# Patient Record
Sex: Male | Born: 1960 | Race: White | Hispanic: No | Marital: Married | Smoking: Former smoker
Health system: Southern US, Community
[De-identification: ages and names within clinical notes are randomized; demographics above are authoritative.]

## PROBLEM LIST (undated history)

## (undated) DIAGNOSIS — I10 Essential (primary) hypertension: Secondary | ICD-10-CM

## (undated) DIAGNOSIS — M1990 Unspecified osteoarthritis, unspecified site: Secondary | ICD-10-CM

## (undated) DIAGNOSIS — J301 Allergic rhinitis due to pollen: Secondary | ICD-10-CM

## (undated) DIAGNOSIS — R7303 Prediabetes: Secondary | ICD-10-CM

## (undated) DIAGNOSIS — K219 Gastro-esophageal reflux disease without esophagitis: Secondary | ICD-10-CM

## (undated) DIAGNOSIS — E559 Vitamin D deficiency, unspecified: Secondary | ICD-10-CM

## (undated) DIAGNOSIS — Z87442 Personal history of urinary calculi: Secondary | ICD-10-CM

## (undated) DIAGNOSIS — I499 Cardiac arrhythmia, unspecified: Secondary | ICD-10-CM

## (undated) DIAGNOSIS — R42 Dizziness and giddiness: Secondary | ICD-10-CM

## (undated) DIAGNOSIS — K8020 Calculus of gallbladder without cholecystitis without obstruction: Secondary | ICD-10-CM

## (undated) HISTORY — PX: EYE SURGERY: SHX253

## (undated) HISTORY — DX: Dizziness and giddiness: R42

## (undated) HISTORY — DX: Cardiac arrhythmia, unspecified: I49.9

## (undated) HISTORY — DX: Vitamin D deficiency, unspecified: E55.9

## (undated) HISTORY — DX: Allergic rhinitis due to pollen: J30.1

## (undated) HISTORY — PX: CHOLECYSTECTOMY: SHX55

## (undated) HISTORY — DX: Essential (primary) hypertension: I10

---

## 1988-07-11 HISTORY — PX: EYE SURGERY: SHX253

## 2002-04-23 ENCOUNTER — Ambulatory Visit (HOSPITAL_COMMUNITY): Admission: RE | Admit: 2002-04-23 | Discharge: 2002-04-23 | Payer: Self-pay | Admitting: *Deleted

## 2002-04-23 ENCOUNTER — Encounter: Payer: Self-pay | Admitting: *Deleted

## 2004-08-03 ENCOUNTER — Emergency Department (HOSPITAL_COMMUNITY): Admission: EM | Admit: 2004-08-03 | Discharge: 2004-08-03 | Payer: Self-pay | Admitting: Family Medicine

## 2007-10-24 ENCOUNTER — Emergency Department (HOSPITAL_COMMUNITY): Admission: EM | Admit: 2007-10-24 | Discharge: 2007-10-24 | Payer: Self-pay | Admitting: Emergency Medicine

## 2008-07-07 ENCOUNTER — Emergency Department (HOSPITAL_COMMUNITY): Admission: EM | Admit: 2008-07-07 | Discharge: 2008-07-07 | Payer: Self-pay | Admitting: Family Medicine

## 2008-10-06 ENCOUNTER — Emergency Department (HOSPITAL_COMMUNITY): Admission: EM | Admit: 2008-10-06 | Discharge: 2008-10-06 | Payer: Self-pay | Admitting: Family Medicine

## 2008-11-21 ENCOUNTER — Emergency Department (HOSPITAL_COMMUNITY): Admission: EM | Admit: 2008-11-21 | Discharge: 2008-11-21 | Payer: Self-pay | Admitting: Family Medicine

## 2011-04-05 LAB — URINALYSIS, ROUTINE W REFLEX MICROSCOPIC
Bilirubin Urine: NEGATIVE
Glucose, UA: NEGATIVE
Ketones, ur: NEGATIVE
Leukocytes, UA: NEGATIVE
Protein, ur: NEGATIVE
pH: 6.5

## 2011-04-05 LAB — BASIC METABOLIC PANEL
BUN: 13
CO2: 26
Calcium: 9.5
Chloride: 104
Creatinine, Ser: 0.89
GFR calc Af Amer: >60
GFR calc non Af Amer: >60
Glucose, Bld: 116 — ABNORMAL HIGH
Potassium: 3.8
Sodium: 136

## 2011-04-05 LAB — CBC
HCT: 49.5
Hemoglobin: 16.1
MCHC: 32.6
MCV: 88.9
Platelets: 231
RBC: 5.56
RDW: 12.7
WBC: 5.3

## 2011-04-05 LAB — DIFFERENTIAL
Eosinophils Absolute: 0.1
Eosinophils Relative: 1
Lymphocytes Relative: 29
Lymphs Abs: 1.5
Monocytes Absolute: 0.5

## 2011-04-05 LAB — URINE MICROSCOPIC-ADD ON

## 2016-08-05 DIAGNOSIS — R0789 Other chest pain: Secondary | ICD-10-CM | POA: Diagnosis not present

## 2016-08-08 DIAGNOSIS — R0789 Other chest pain: Secondary | ICD-10-CM | POA: Diagnosis not present

## 2016-08-25 DIAGNOSIS — R079 Chest pain, unspecified: Secondary | ICD-10-CM

## 2016-08-25 HISTORY — DX: Chest pain, unspecified: R07.9

## 2016-08-30 DIAGNOSIS — R079 Chest pain, unspecified: Secondary | ICD-10-CM | POA: Diagnosis not present

## 2017-02-22 DIAGNOSIS — H66002 Acute suppurative otitis media without spontaneous rupture of ear drum, left ear: Secondary | ICD-10-CM | POA: Diagnosis not present

## 2017-07-25 DIAGNOSIS — J209 Acute bronchitis, unspecified: Secondary | ICD-10-CM | POA: Diagnosis not present

## 2017-07-25 DIAGNOSIS — J0100 Acute maxillary sinusitis, unspecified: Secondary | ICD-10-CM | POA: Diagnosis not present

## 2017-08-14 DIAGNOSIS — J209 Acute bronchitis, unspecified: Secondary | ICD-10-CM | POA: Diagnosis not present

## 2017-09-25 ENCOUNTER — Emergency Department (HOSPITAL_COMMUNITY): Payer: 59

## 2017-09-25 ENCOUNTER — Emergency Department (HOSPITAL_COMMUNITY)
Admission: EM | Admit: 2017-09-25 | Discharge: 2017-09-25 | Disposition: A | Discharge status: Home / Self Care | Payer: 59 | Attending: Emergency Medicine | Admitting: Emergency Medicine

## 2017-09-25 ENCOUNTER — Encounter (HOSPITAL_COMMUNITY): Payer: Self-pay | Admitting: Emergency Medicine

## 2017-09-25 DIAGNOSIS — R42 Dizziness and giddiness: Secondary | ICD-10-CM | POA: Diagnosis present

## 2017-09-25 LAB — URINALYSIS, ROUTINE W REFLEX MICROSCOPIC
Bilirubin Urine: NEGATIVE
Glucose, UA: NEGATIVE mg/dL
Hgb urine dipstick: NEGATIVE
KETONES UR: NEGATIVE mg/dL
LEUKOCYTES UA: NEGATIVE
NITRITE: NEGATIVE
Protein, ur: NEGATIVE mg/dL
SPECIFIC GRAVITY, URINE: 1.018 (ref 1.005–1.030)
pH: 5 (ref 5.0–8.0)

## 2017-09-25 LAB — BASIC METABOLIC PANEL
ANION GAP: 10 (ref 5–15)
BUN: 20 mg/dL (ref 6–20)
CO2: 24 mmol/L (ref 22–32)
Calcium: 9.6 mg/dL (ref 8.9–10.3)
Chloride: 109 mmol/L (ref 101–111)
Creatinine, Ser: 0.84 mg/dL (ref 0.61–1.24)
GFR calc Af Amer: >60 mL/min (ref >60)
Glucose, Bld: 114 mg/dL — ABNORMAL HIGH (ref 65–99)
POTASSIUM: 3.9 mmol/L (ref 3.5–5.1)
SODIUM: 143 mmol/L (ref 135–145)

## 2017-09-25 LAB — CBC
HCT: 47.5 % (ref 39.0–52.0)
HEMOGLOBIN: 17.1 g/dL — AB (ref 13.0–17.0)
MCH: 32.1 pg (ref 26.0–34.0)
MCHC: 36 g/dL (ref 30.0–36.0)
MCV: 89.1 fL (ref 78.0–100.0)
Platelets: 218 10*3/uL (ref 150–400)
RBC: 5.33 MIL/uL (ref 4.22–5.81)
RDW: 12.8 % (ref 11.5–15.5)
WBC: 4.9 10*3/uL (ref 4.0–10.5)

## 2017-09-25 LAB — CBG MONITORING, ED: Glucose-Capillary: 116 mg/dL — ABNORMAL HIGH (ref 65–99)

## 2017-09-25 MED ORDER — MECLIZINE HCL 25 MG PO TABS
12.5000 mg | ORAL_TABLET | Freq: Once | ORAL | Status: AC
  Administered 2017-09-25: 12.5 mg via ORAL
  Filled 2017-09-25: qty 1

## 2017-09-25 MED ORDER — LORATADINE 10 MG PO TABS
10.0000 mg | ORAL_TABLET | Freq: Once | ORAL | Status: AC
  Administered 2017-09-25: 10 mg via ORAL
  Filled 2017-09-25: qty 1

## 2017-09-25 MED ORDER — MECLIZINE HCL 12.5 MG PO TABS: 12.5000 mg | ORAL_TABLET | Freq: Three times a day (TID) | ORAL | 0 refills | Status: DC | PRN

## 2017-09-25 MED ORDER — PSEUDOEPHEDRINE HCL ER 120 MG PO TB12
120.0000 mg | ORAL_TABLET | Freq: Once | ORAL | Status: AC
  Administered 2017-09-25: 120 mg via ORAL
  Filled 2017-09-25: qty 1

## 2017-09-25 NOTE — ED Notes (Signed)
MD notified of symptoms

## 2017-09-25 NOTE — Discharge Instructions (Signed)
It was our pleasure to provide your ER care today - we hope that you feel better.  Take antivert as need for dizziness - no driving when taking antivert, or when feeling dizzy.  Try zyrtec-d or claritin-d (available over the counter) for fluid/ear congestion.   Follow up with primary care doctor/ENT doctor in 1  week if symptoms fail to improve/resolve.  Also have your blood pressure rechecked by primary care doctor as it is high today, and follow up with them regarding your cholesterol/triglycerides.   Return to ER if worse, new symptoms, new or severe pain, passing out, new numbness/weakness, other concern.

## 2017-09-25 NOTE — ED Notes (Signed)
Patient transported to MRI 

## 2017-09-25 NOTE — ED Provider Notes (Addendum)
[facility]-EMERGENCY DEPT Provider Note   CSN: [id number] Arrival date & time: 09/25/17  0800     History   Chief Complaint Chief Complaint  Patient presents with  . Dizziness    HPI David Whitaker is a 57 y.o. male.  Patient c/o dizziness/unsteadiness for the past 3 weeks.  States episodes seem to wax and wane. Also states in general, head doesn't feel 'right'. No focal pain or headache. Denies neck pain or stiffness. Dizziness described vaguely as both mild room movement sensation, and also at times an unsteadiness when on feet, with mild lightheaded component. No syncope. Symptoms occur at rest. At times worse w head movement, change of position, and/or standing. Denies any associated chest pain or discomfort, no palpitations. Denies cough, nasal congestion or uri symptoms. No ear pain or tinnitus. No hearing loss. No extremity numbness/weakness. No blood loss or melena. No recent change in meds.    The history is provided by the patient.  Dizziness  Associated symptoms: no chest pain, no headaches and no shortness of breath     History reviewed. No pertinent past medical history.  There are no active problems to display for this patient.   History reviewed. No pertinent surgical history.     Home Medications    Prior to Admission medications   Not on File    Family History No family history on file.  Social History Social History   Tobacco Use  . Smoking status: Never Smoker  Substance Use Topics  . Alcohol use: Yes  . Drug use: No     Allergies   Patient has no allergy information on record.   Review of Systems Review of Systems  Constitutional: Negative for fever.  HENT: Negative for sore throat.   Eyes: Negative for redness.  Respiratory: Negative for shortness of breath.   Cardiovascular: Negative for chest pain.  Gastrointestinal: Negative for abdominal pain.  Genitourinary: Negative for flank pain.  Musculoskeletal:  Negative for back pain and neck pain.  Skin: Negative for rash.  Neurological: Positive for dizziness and light-headedness. Negative for headaches.  Hematological: Does not bruise/bleed easily.  Psychiatric/Behavioral: Negative for confusion.     Physical Exam Updated Vital Signs BP (!) 153/99 (BP Location: Left Arm)   Pulse 90   Temp (!) 97.3 F (36.3 C) (Oral)   Resp 16   Ht 1.778 m (5\' 10" )   Wt 97.5 kg (215 lb)   SpO2 99%   BMI 30.85 kg/m   Physical Exam  Constitutional: He appears well-developed and well-nourished. No distress.  HENT:  Head: Atraumatic.  Nose: Nose normal.  Mouth/Throat: Oropharynx is clear and moist.  Clear fluid behind tms. No acute om. No mastoid tenderness.   Eyes: Conjunctivae and EOM are normal. Pupils are equal, round, and reactive to light.  Neck: Neck supple. No tracheal deviation present. No thyromegaly present.  No bruits.   Cardiovascular: Normal rate, regular rhythm, normal heart sounds and intact distal pulses. Exam reveals no gallop and no friction rub.  No murmur heard. Pulmonary/Chest: Effort normal and breath sounds normal. No accessory muscle usage. No respiratory distress.  Abdominal: Soft. Bowel sounds are normal. He exhibits no distension. There is no tenderness.  Genitourinary:  Genitourinary Comments: No cva tenderness  Musculoskeletal: He exhibits no edema.  Neurological: He is alert. No cranial nerve deficit.  Speech clear/fluent. Motor intact bil, stre 5/5. No pronator drift. sens grossly intact. Gait mildly unsteady. No nystagmus.   Skin: Skin is warm  and dry. He is not diaphoretic.  Psychiatric: He has a normal mood and affect.  Nursing note and vitals reviewed.    ED Treatments / Results  Labs (all labs ordered are listed, but only abnormal results are displayed) Results for orders placed or performed during the hospital encounter of 09/25/17  Basic metabolic panel  Result Value Ref Range   Sodium 143 135 - 145  mmol/L   Potassium 3.9 3.5 - 5.1 mmol/L   Chloride 109 101 - 111 mmol/L   CO2 24 22 - 32 mmol/L   Glucose, Bld 114 (H) 65 - 99 mg/dL   BUN 20 6 - 20 mg/dL   Creatinine, Ser 4.090.84 0.61 - 1.24 mg/dL   Calcium 9.6 8.9 - 81.110.3 mg/dL   GFR calc non Af Amer >60 >60 mL/min   GFR calc Af Amer >60 >60 mL/min   Anion gap 10 5 - 15  CBC  Result Value Ref Range   WBC 4.9 4.0 - 10.5 K/uL   RBC 5.33 4.22 - 5.81 MIL/uL   Hemoglobin 17.1 (H) 13.0 - 17.0 g/dL   HCT 91.447.5 78.239.0 - 95.652.0 %   MCV 89.1 78.0 - 100.0 fL   MCH 32.1 26.0 - 34.0 pg   MCHC 36.0 30.0 - 36.0 g/dL   RDW 21.312.8 08.611.5 - 57.815.5 %   Platelets 218 150 - 400 K/uL  Urinalysis, Routine w reflex microscopic  Result Value Ref Range   Color, Urine YELLOW YELLOW   APPearance CLEAR CLEAR   Specific Gravity, Urine 1.018 1.005 - 1.030   pH 5.0 5.0 - 8.0   Glucose, UA NEGATIVE NEGATIVE mg/dL   Hgb urine dipstick NEGATIVE NEGATIVE   Bilirubin Urine NEGATIVE NEGATIVE   Ketones, ur NEGATIVE NEGATIVE mg/dL   Protein, ur NEGATIVE NEGATIVE mg/dL   Nitrite NEGATIVE NEGATIVE   Leukocytes, UA NEGATIVE NEGATIVE  CBG monitoring, ED  Result Value Ref Range   Glucose-Capillary 116 (H) 65 - 99 mg/dL    EKG  EKG Interpretation  Date/Time:  Monday September 25 2017 08:23:31 EDT Ventricular Rate:  88 PR Interval:    QRS Duration: 88 QT Interval:  367 QTC Calculation: 444 R Axis:   21 Text Interpretation:  Sinus rhythm Normal ECG No previous tracing Confirmed by Cathren LaineSteinl, Brinley Treanor ([id number]) on 09/25/2017 11:47:07 AM       Radiology Mr Brain Wo Contrast  Result Date: 09/25/2017 CLINICAL DATA:  Ataxia.  Intermittent dizziness for 3 weeks. EXAM: MRI HEAD WITHOUT CONTRAST TECHNIQUE: Multiplanar, multiecho pulse sequences of the brain and surrounding structures were obtained without intravenous contrast. COMPARISON:  None. FINDINGS: Brain: There is no evidence of acute infarct, intracranial hemorrhage, mass effect, or extra-axial fluid collection. 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 Scattered small foci of T2 hyperintensity are present in the predominantly subcortical cerebral white matter bilaterally, nonspecific but compatible with mild chronic small vessel ischemic disease. Vascular: Major intracranial vascular flow voids are preserved. Skull and upper cervical spine: Mild marrow heterogeneity in the clivus without a suspicious focal osseous lesion identified. Hyperostosis frontalis interna. Sinuses/Orbits: Unremarkable orbits. Paranasal sinuses and mastoid air cells are clear. Other: None. IMPRESSION: 1. No acute intracranial abnormality. 2. Mild chronic small vessel ischemic disease. 3. Incidental pericallosal lipoma. Electronically Signed   By: Sebastian AcheAllen  Grady M.D.   On: 09/25/2017 11:12    Procedures Procedures (including critical care time)  Medications Ordered in ED Medications - No data to display   Initial  Impression / Assessment and Plan / ED Course  I have reviewed the triage vital signs and the nursing notes.  Pertinent labs & imaging results that were available during my care of the patient were reviewed by me and considered in my medical decision making (see chart for details).  Iv ns. Labs. MRI imaging.  Reviewed nursing notes and prior charts for additional history.   Mri neg for acute process/stroke.  Will try claritin d, antivert for symptom relief.  Patient currently appears stable for d/c.     Final Clinical Impressions(s) / ED Diagnoses   Final diagnoses:  None    ED Discharge Orders    None        Cathren Laine, MD 09/25/17 1147

## 2017-09-25 NOTE — ED Triage Notes (Signed)
Per pt, states he has been dizzy on and off for 3 weeks-states it feels like it's motion sickness-states when he closes his eyes he gets nauseated-states it happens when he first wakes up-states he stumbles-states the top of his head and his neck feel funny-no pain-states he felt like he was going to pass out

## 2018-05-15 DIAGNOSIS — M25562 Pain in left knee: Secondary | ICD-10-CM | POA: Diagnosis not present

## 2018-06-14 DIAGNOSIS — J019 Acute sinusitis, unspecified: Secondary | ICD-10-CM | POA: Diagnosis not present

## 2018-06-14 DIAGNOSIS — R51 Headache: Secondary | ICD-10-CM | POA: Diagnosis not present

## 2018-06-15 ENCOUNTER — Other Ambulatory Visit: Payer: Self-pay

## 2018-06-15 ENCOUNTER — Emergency Department (HOSPITAL_COMMUNITY): Payer: 59

## 2018-06-15 ENCOUNTER — Emergency Department (HOSPITAL_COMMUNITY)
Admission: EM | Admit: 2018-06-15 | Discharge: 2018-06-15 | Disposition: A | Discharge status: Home / Self Care | Payer: 59 | Attending: Emergency Medicine | Admitting: Emergency Medicine

## 2018-06-15 ENCOUNTER — Encounter (HOSPITAL_COMMUNITY): Payer: Self-pay

## 2018-06-15 DIAGNOSIS — R519 Headache, unspecified: Secondary | ICD-10-CM

## 2018-06-15 DIAGNOSIS — R51 Headache: Secondary | ICD-10-CM | POA: Diagnosis not present

## 2018-06-15 DIAGNOSIS — H538 Other visual disturbances: Secondary | ICD-10-CM | POA: Diagnosis not present

## 2018-06-15 HISTORY — DX: Calculus of gallbladder without cholecystitis without obstruction: K80.20

## 2018-06-15 MED ORDER — SODIUM CHLORIDE 0.9 % IV BOLUS
1000.0000 mL | Freq: Once | INTRAVENOUS | Status: AC
  Administered 2018-06-15: 1000 mL via INTRAVENOUS

## 2018-06-15 MED ORDER — PREDNISONE 50 MG PO TABS: 50.0000 mg | ORAL_TABLET | Freq: Every day | ORAL | 0 refills | Status: DC

## 2018-06-15 MED ORDER — PROCHLORPERAZINE EDISYLATE 10 MG/2ML IJ SOLN
10.0000 mg | Freq: Once | INTRAMUSCULAR | Status: AC
  Administered 2018-06-15: 10 mg via INTRAVENOUS
  Filled 2018-06-15: qty 2

## 2018-06-15 MED ORDER — KETOROLAC TROMETHAMINE 30 MG/ML IJ SOLN
30.0000 mg | Freq: Once | INTRAMUSCULAR | Status: AC
  Administered 2018-06-15: 30 mg via INTRAVENOUS
  Filled 2018-06-15: qty 1

## 2018-06-15 MED ORDER — DIPHENHYDRAMINE HCL 50 MG/ML IJ SOLN
25.0000 mg | Freq: Once | INTRAMUSCULAR | Status: AC
  Administered 2018-06-15: 25 mg via INTRAVENOUS
  Filled 2018-06-15: qty 1

## 2018-06-15 MED ORDER — BUTALBITAL-APAP-CAFFEINE 50-325-40 MG PO TABS: 1.0000 | ORAL_TABLET | Freq: Four times a day (QID) | ORAL | 0 refills | Status: AC | PRN

## 2018-06-15 NOTE — ED Notes (Signed)
EDPA CHRIS Provider at bedside. 

## 2018-06-15 NOTE — ED Notes (Signed)
Patient transported to CT 

## 2018-06-15 NOTE — ED Triage Notes (Addendum)
Patient c/o headache x 10 days. Patient states he went to an UC last night in [place] and was prescribed an antibiotic, Tramadol, and had a steroid shot and Prednisione Rx.. Patient states the pain was worse last night.  Patient states he has intermittent blurred vision. Patient denies any numbness of extremities. Patient states last night he had sensitivity to light.

## 2018-06-15 NOTE — Discharge Instructions (Addendum)
Return here as needed. Follow up with your doctor. °

## 2018-06-19 NOTE — ED Provider Notes (Signed)
[facility]-EMERGENCY DEPT Provider Note   CSN: [id number] Arrival date & time: 06/15/18  1329     History   Chief Complaint Chief Complaint  Patient presents with  . Headache    HPI David Whitaker is a 57 y.o. male.  HPI Patient presents to the emergency department with a headache that started about 10 days ago.  He states he was seen in urgent care yesterday prescribed an antibiotic and given a steroid shot due to the fact they felt it may be sinus related.  Patient states that he took over-the-counter medications with no relief of his symptoms.  The patient states that he has had some intermittent blurred vision.  The patient states that he has had some sensitivity to light.  The patient denies chest pain, shortness of breath, neck pain, fever, cough, weakness, numbness, dizziness, anorexia, edema, abdominal pain, nausea, vomiting, diarrhea, rash, back pain, dysuria, hematemesis, bloody stool, near syncope, or syncope. Past Medical History:  Diagnosis Date  . Gallstones     There are no active problems to display for this patient.   History reviewed. No pertinent surgical history.      Home Medications    Prior to Admission medications   Medication Sig Start Date End Date Taking? Authorizing Provider  amoxicillin-clavulanate (AUGMENTIN) 875-125 MG tablet Take 1 tablet by mouth 2 (two) times daily.   Yes [provider]  aspirin-acetaminophen-caffeine (EXCEDRIN MIGRAINE) 5641509136 MG tablet Take 1 tablet by mouth every 6 (six) hours as needed for headache or migraine.   Yes [provider]  ibuprofen (ADVIL,MOTRIN) 200 MG tablet Take 200 mg by mouth every 6 (six) hours as needed for headache or moderate pain.   Yes [provider]  traMADol (ULTRAM) 50 MG tablet Take 50 mg by mouth every 6 (six) hours as needed for moderate pain or severe pain.   Yes [provider]  butalbital-acetaminophen-caffeine (FIORICET,  ESGIC) 50-325-40 MG tablet Take 1 tablet by mouth every 6 (six) hours as needed for headache. 06/15/18 06/15/19  Cybil Senegal, Cristal Deer, PA-C  meclizine (ANTIVERT) 12.5 MG tablet Take 1 tablet (12.5 mg total) by mouth 3 (three) times daily as needed for dizziness. Patient not taking: Reported on 06/15/2018 09/25/17   Cathren Laine, MD  predniSONE (DELTASONE) 50 MG tablet Take 1 tablet (50 mg total) by mouth daily with breakfast. 06/15/18   Charlestine Night, PA-C    Family History Family History  Problem Relation Age of Onset  . Diabetes Mother   . Heart failure Mother   . Cancer Mother   . Diabetes Father   . Heart failure Father     Social History Social History   Tobacco Use  . Smoking status: Never Smoker  . Smokeless tobacco: Never Used  Substance Use Topics  . Alcohol use: Yes  . Drug use: No     Allergies   Patient has no known allergies.   Review of Systems Review of Systems All other systems negative except as documented in the HPI. All pertinent positives and negatives as reviewed in the HPI.  Physical Exam Updated Vital Signs BP (!) 162/79   Pulse 84   Temp (!) 97.5 F (36.4 C) (Oral)   Resp 14   Ht 5\' 10"  (1.778 m)   Wt 104.3 kg   SpO2 97%   BMI 33.00 kg/m   Physical Exam  Constitutional: He is oriented to person, place, and time. He appears well-developed and well-nourished. No distress.  HENT:  Head:  Normocephalic and atraumatic.  Mouth/Throat: Oropharynx is clear and moist.  Eyes: Pupils are equal, round, and reactive to light.  Neck: Normal range of motion. Neck supple.  Cardiovascular: Normal rate, regular rhythm and normal heart sounds. Exam reveals no gallop and no friction rub.  No murmur heard. Pulmonary/Chest: Effort normal and breath sounds normal. No respiratory distress. He has no wheezes.  Abdominal: Soft. Bowel sounds are normal. He exhibits no distension. There is no tenderness.  Neurological: He is alert and oriented to person, place,  and time. He has normal strength. He is not disoriented. He displays normal reflexes. No cranial nerve deficit or sensory deficit. He exhibits normal muscle tone. Coordination and gait normal. GCS eye subscore is 4. GCS verbal subscore is 5. GCS motor subscore is 6.  Skin: Skin is warm and dry. Capillary refill takes less than 2 seconds. No rash noted. No erythema.  Psychiatric: He has a normal mood and affect. His behavior is normal.  Nursing note and vitals reviewed.    ED Treatments / Results  Labs (all labs ordered are listed, but only abnormal results are displayed) Labs Reviewed - No data to display  EKG None  Radiology No results found.  Procedures Procedures (including critical care time)  Medications Ordered in ED Medications  sodium chloride 0.9 % bolus 1,000 mL (0 mLs Intravenous Stopped 06/15/18 1813)  ketorolac (TORADOL) 30 MG/ML injection 30 mg (30 mg Intravenous Given 06/15/18 1719)  prochlorperazine (COMPAZINE) injection 10 mg (10 mg Intravenous Given 06/15/18 1714)  diphenhydrAMINE (BENADRYL) injection 25 mg (25 mg Intravenous Given 06/15/18 1713)     Initial Impression / Assessment and Plan / ED Course  I have reviewed the triage vital signs and the nursing notes.  Pertinent labs & imaging results that were available during my care of the patient were reviewed by me and considered in my medical decision making (see chart for details).     Advised the patient that at this time we do not see any CT scan abnormalities and he does not have any significant abnormalities noted on physical exam such as weakness or numbness in his extremities.  I did advise the patient that since his headache is completely resolved following our treatments here this could been a migraine type headache.  The patient is advised to return for any worsening in his condition.  Have also advised him to follow-up with his primary doctor.  Patient agrees to the plan and all questions were  answered.  Final Clinical Impressions(s) / ED Diagnoses   Final diagnoses:  Bad headache    ED Discharge Orders         Ordered    predniSONE (DELTASONE) 50 MG tablet  Daily with breakfast     06/15/18 1922    butalbital-acetaminophen-caffeine (FIORICET, ESGIC) 50-325-40 MG tablet  Every 6 hours PRN     06/15/18 [address]1922           Aanshi Batchelder, PA-C 06/19/18 1721    Loren RacerYelverton, David, MD 06/22/18 [id number]

## 2018-06-22 DIAGNOSIS — J0100 Acute maxillary sinusitis, unspecified: Secondary | ICD-10-CM | POA: Diagnosis not present

## 2018-10-31 DIAGNOSIS — H81399 Other peripheral vertigo, unspecified ear: Secondary | ICD-10-CM | POA: Diagnosis not present

## 2019-09-20 IMAGING — CT CT HEAD W/O CM
3 series · 15 of 47 positions shown, 18 images · non-contrast
Comparison: Brain MRI dated 09/25/2017.

CLINICAL DATA: Headache for 10 days. Severe headache, worst
headache of life.

EXAM:
CT HEAD WITHOUT CONTRAST
TECHNIQUE: Contiguous axial images were obtained from the base of the skull
through the vertex without intravenous contrast.

[Series 2: head wo · axial · 0.43mm/px · z∈[-107,+23]mm · 9 of 32 slices shown, 12 images]
[im 3/32  brain]
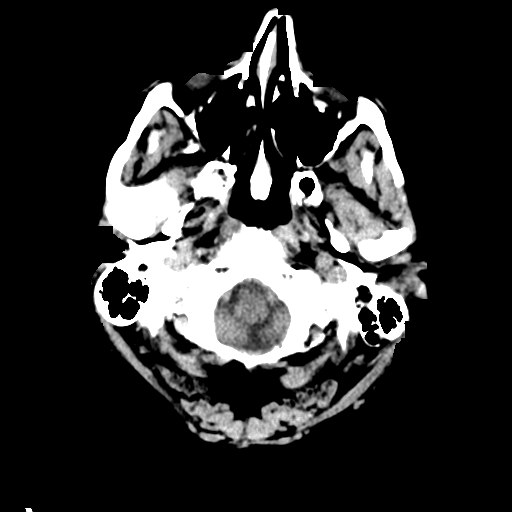
[im 3/32  bone]
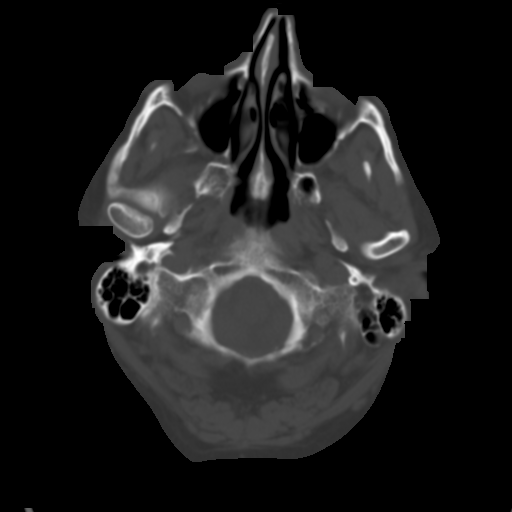
[im 6/32  brain]
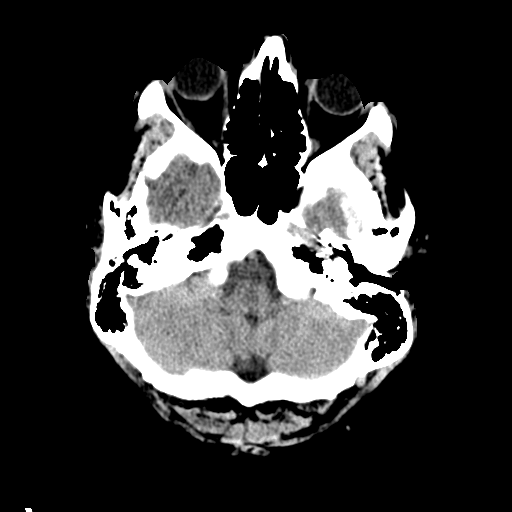
[im 9/32  brain]
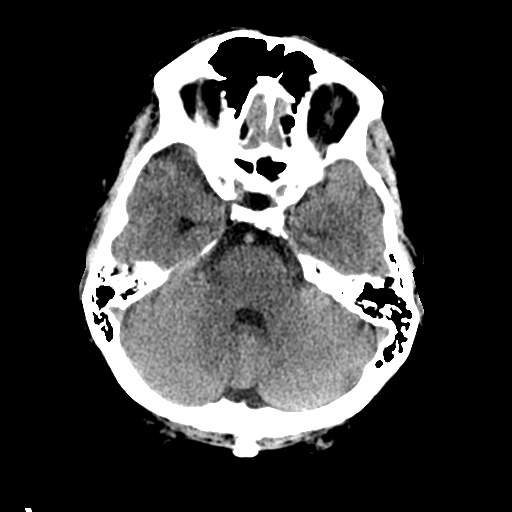
[im 12/32  brain]
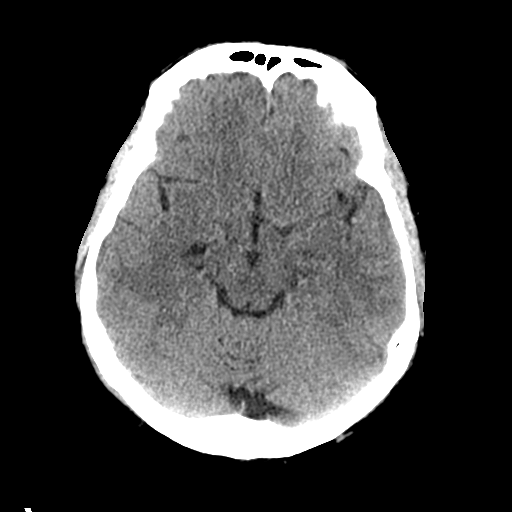
[im 17/32  brain]
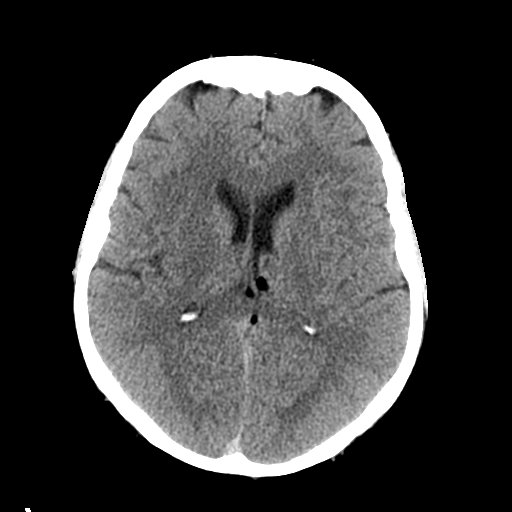
[im 17/32  bone]
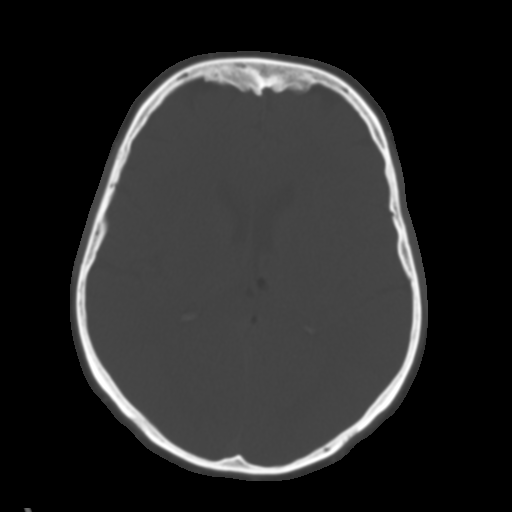
[im 20/32  brain]
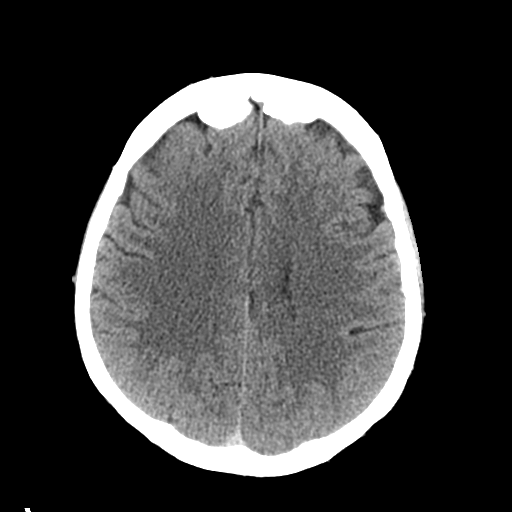
[im 23/32  brain]
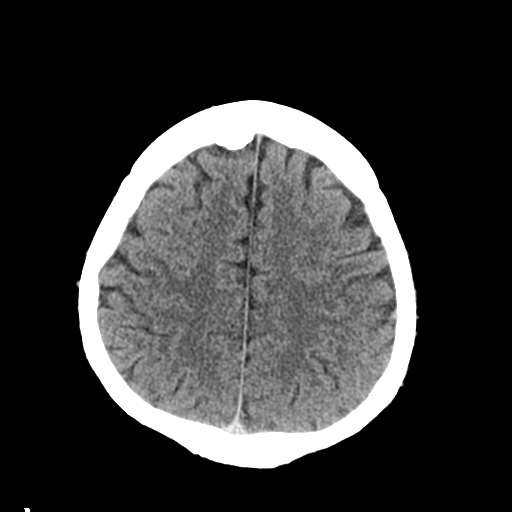
[im 26/32  brain]
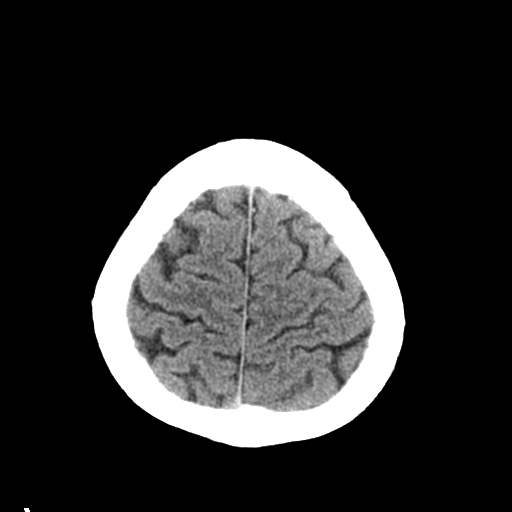
[im 29/32  brain]
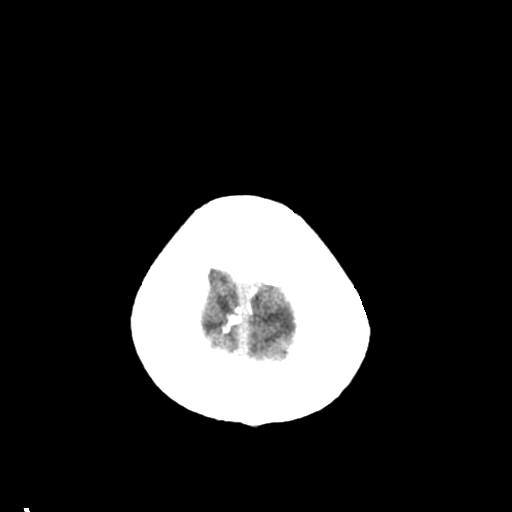
[im 29/32  bone]
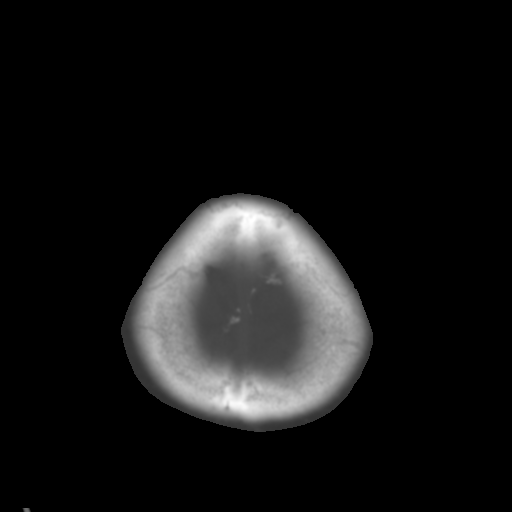

[Series 4: coronal soft tissue · coronal · 0.31mm/px · 3 of 65 slices shown]
[im 22/65  brain]
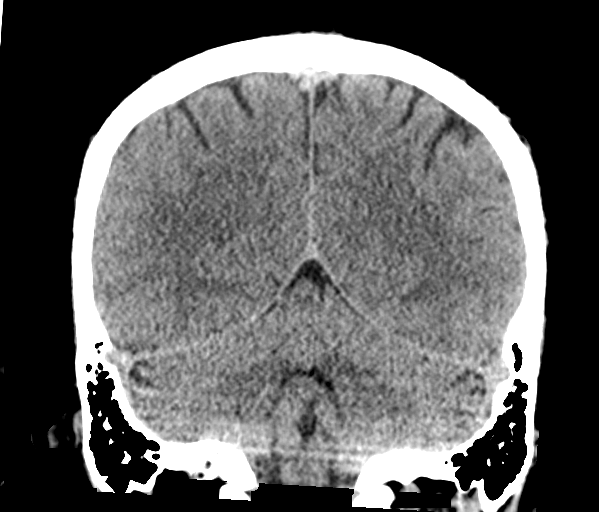
[im 29/65  brain]
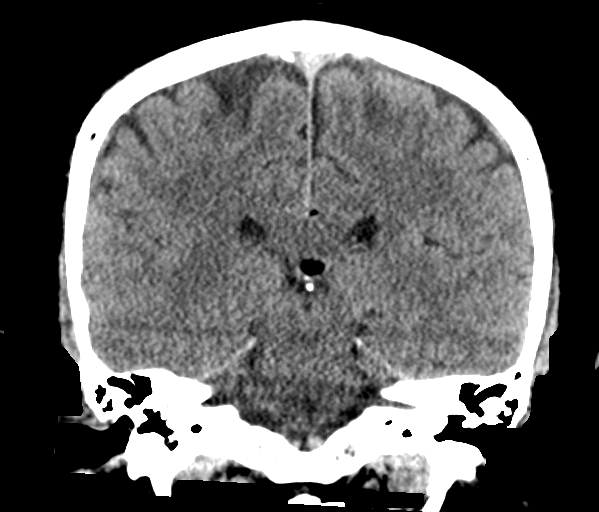
[im 36/65  brain]
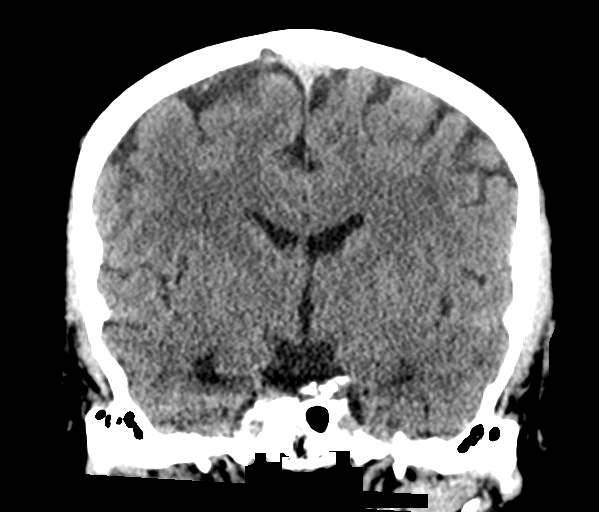

[Series 5: sagittal soft tissue · sagittal · 0.31mm/px · 3 of 55 slices shown]
[im 19/55  brain]
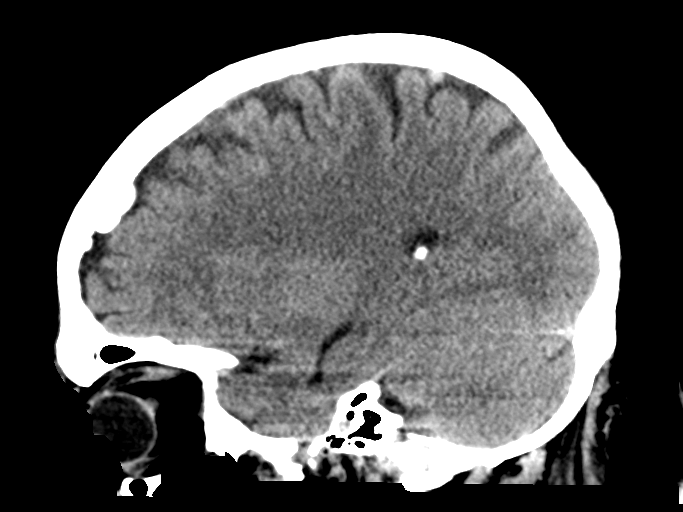
[im 28/55  brain]
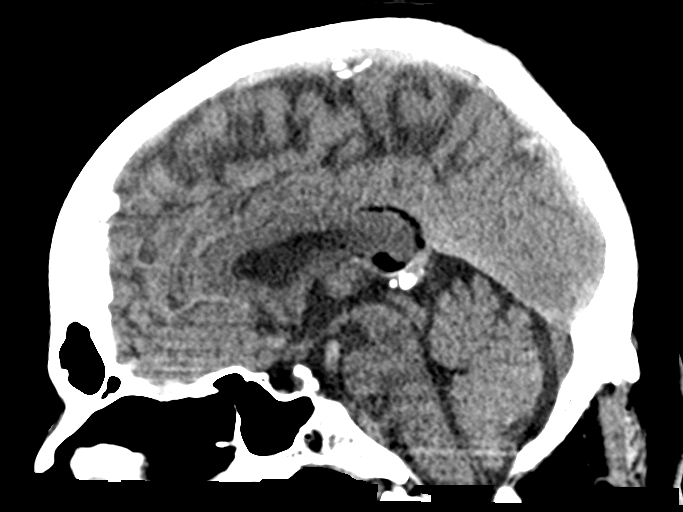
[im 37/55  brain]
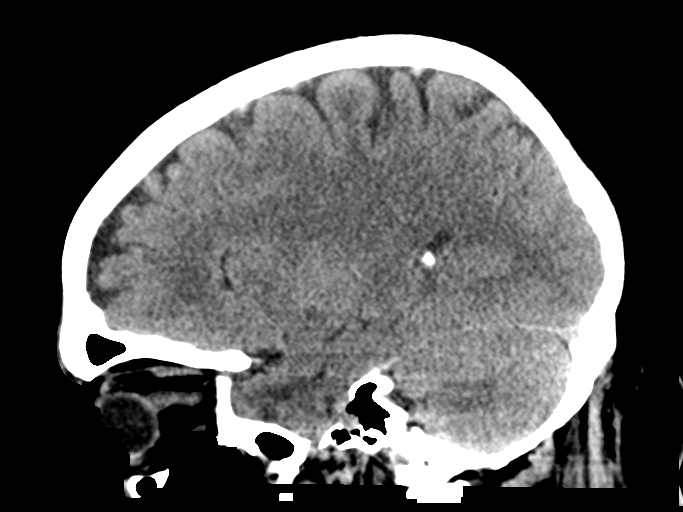

[15 of 47 positions shown; findings below may reference images not displayed]

FINDINGS: Brain: Ventricles are within normal limits in size and
configuration. All areas of the brain demonstrate appropriate
gray-white matter differentiation. There is no mass, hemorrhage,
edema or other evidence of acute parenchymal abnormality. No
extra-axial hemorrhage.

Vascular: No hyperdense vessel or unexpected calcification.

Skull: Normal. Negative for fracture or focal lesion.

Sinuses/Orbits: No acute finding.

Other: None.
IMPRESSION: Negative head CT. No intracranial mass, hemorrhage or edema.

## 2019-11-22 DIAGNOSIS — R079 Chest pain, unspecified: Secondary | ICD-10-CM | POA: Diagnosis not present

## 2019-11-23 DIAGNOSIS — R079 Chest pain, unspecified: Secondary | ICD-10-CM

## 2020-04-07 HISTORY — PX: CHOLECYSTECTOMY: SHX55

## 2020-04-21 DIAGNOSIS — K8000 Calculus of gallbladder with acute cholecystitis without obstruction: Secondary | ICD-10-CM

## 2020-04-21 DIAGNOSIS — Z09 Encounter for follow-up examination after completed treatment for conditions other than malignant neoplasm: Secondary | ICD-10-CM

## 2020-04-21 HISTORY — DX: Calculus of gallbladder with acute cholecystitis without obstruction: K80.00

## 2020-04-21 HISTORY — DX: Encounter for follow-up examination after completed treatment for conditions other than malignant neoplasm: Z09

## 2020-06-08 DIAGNOSIS — K219 Gastro-esophageal reflux disease without esophagitis: Secondary | ICD-10-CM

## 2020-06-08 DIAGNOSIS — Z800 Family history of malignant neoplasm of digestive organs: Secondary | ICD-10-CM

## 2020-06-08 DIAGNOSIS — Z1211 Encounter for screening for malignant neoplasm of colon: Secondary | ICD-10-CM | POA: Insufficient documentation

## 2020-06-08 DIAGNOSIS — R1013 Epigastric pain: Secondary | ICD-10-CM

## 2020-06-08 HISTORY — DX: Epigastric pain: R10.13

## 2020-06-08 HISTORY — DX: Family history of malignant neoplasm of digestive organs: Z80.0

## 2020-06-08 HISTORY — DX: Encounter for screening for malignant neoplasm of colon: Z12.11

## 2020-06-08 HISTORY — DX: Gastro-esophageal reflux disease without esophagitis: K21.9

## 2022-09-16 ENCOUNTER — Ambulatory Visit: Payer: Self-pay | Admitting: Orthopedic Surgery

## 2022-09-30 ENCOUNTER — Ambulatory Visit: Payer: Self-pay | Admitting: Orthopedic Surgery

## 2022-09-30 NOTE — H&P (Signed)
David Whitaker is an 62 y.o. male.   Chief Complaint: left shoulder pain HPI: Reason for Visit: (normal) visit for: (left shoulder) Context: work injury; 5 months 03/23/22 Location (Upper Extremity): shoulder pain on the left Severity: pain level 2/10; mild Quality: sharp; aching Aggravating Factors: pushing/pulling ; lifting Alleviating Factors: PT/OT Are you working? modified duty (5lb. lifting and no repetitive use of left arm) Medications: helping a little; meloxicam Some pain in the left shoulder when he reaches out. Still on light duty ferret therapy has not helped. He had 2 to 3 hours of relief from the subacromial injection but not any sustained relief with the cortisone portion of the  Still with mild midthoracic pain  Past Medical History:  Diagnosis Date   Gallstones     No past surgical history on file.  Family History  Problem Relation Age of Onset   Diabetes Mother    Heart failure Mother    Cancer Mother    Diabetes Father    Heart failure Father    Social History:  reports that he has never smoked. He has never used smokeless tobacco. He reports current alcohol use. He reports that he does not use drugs.  Allergies: Not on File  Current meds: meloxicam  Review of Systems  Constitutional: Negative.   HENT: Negative.    Eyes: Negative.   Respiratory: Negative.    Cardiovascular: Negative.   Gastrointestinal: Negative.   Endocrine: Negative.   Genitourinary: Negative.   Musculoskeletal:  Positive for arthralgias and joint swelling.  Skin: Negative.   Neurological: Negative.   Psychiatric/Behavioral: Negative.      There were no vitals taken for this visit. Physical Exam Constitutional:      Appearance: Normal appearance.  HENT:     Head: Normocephalic.     Right Ear: External ear normal.     Left Ear: External ear normal.     Nose: Nose normal.     Mouth/Throat:     Pharynx: Oropharynx is clear.  Eyes:     Conjunctiva/sclera: Conjunctivae  normal.  Cardiovascular:     Rate and Rhythm: Normal rate and regular rhythm.     Pulses: Normal pulses.  Pulmonary:     Effort: Pulmonary effort is normal.  Abdominal:     General: Bowel sounds are normal.  Musculoskeletal:     Cervical back: Normal range of motion.     Comments: Constitutional: General Appearance: healthy-appearing and NAD.  Psychiatric: Mood and Affect: normal mood and affect.  Cardiovascular System: Arterial Pulses Left: radial normal and brachial normal. Edema Left: none. Varicosities Right: no varicosities. Varicosities Left: no varicosities.  C-Spine/Neck: Active Range of Motion: flexion normal, extension normal, and no pain elicited on motion.  Shoulders: Inspection Left: no misalignment, atrophy, erythema, swelling, or scapular winging. Bony Palpation Left: no tenderness of the sternoclavicular joint, the coracoid process, the acromioclavicular joint, the bicipital groove, or the scapula. Soft Tissue Palpation Left: no tenderness of the infraspinatus, the teres minor, the subacromial bursa, the axilla, the glenohumeral joint region, the pectoralis major insertion, the sternocleidomastoid, the costochondral junction, the trapezius, the rhomboid, the latissimus dorsi, the serratus, the deltoid, the levator scapulae, or the lateral cuff insertion and tenderness of the supraspinatus and the subdeltoid bursa. Active Range of Motion Left: limited. Special Tests Left: Speed's test negative and Neer's test positive. Stability Left: no laxity, sulcus sign negative, and anterior apprehension test negative. Strength Left: abduction 5/5, adduction 5/5, flexion 5/5, and extension 5/5.  Skin: Left Upper  Extremity: normal.  Neurological System: Biceps Reflex Left: normal (2). Brachioradialis Reflex Left: normal (2). Triceps Reflex Left: normal (2). Sensation on the Left: C5 normal, C6 normal, and C7 normal.  He is nontender over the [employer] joint.  Skin:    General: Skin is warm and  dry.  Neurological:     Mental Status: He is alert.    Three-view x-rays of the shoulder demonstrate AC arthrosis. Type II acromion. Located glenohumeral joint.  Outside MRI independently reviewed by myself demonstrates tendinosis of the supraspinatus mild fraying of the rotator cuff. Moderately severe arthrosis AC joint. Edema in the subdeltoid bursa.  X-rays of the thoracic spine report outside indicated no fracture. A mild dextrorotatory scoliosis. Multilevel anterior osteophytic spurring  Assessment/Plan Impression:  1. Left shoulder strain/contusion 2. 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 Thoracic strain with aggravation of his underlying thoracic spondylosis  Plan:  We discussed options at this point in time which would be to currently live with his symptoms and have him undergo a functional capacity exam. Versus a repeat subacromial injection. Other option would be an arthroscopic subacromial decompression. He reports his symptoms is over 5 months in duration. No help from his physical therapy and he has significant restriction in his ability to reach and lift.  Again we discussed activity modification strategies to avoid reinjury as well as impingement. He is no pain from his [facility] joint.  We discussed surgical intervention which would basically be a shoulder arthroscopy and subacromial decompression and bursectomy. Possible clavicular plasty if the clavicle was involved with the impingement.  Again his other option is to live with his symptoms. He would like to proceed with the surgery  I had an extensive discussion with the patient concerning her pathology and relevant anatomy. To the persistence of their symptoms despite conservative treatment to include an exercise program, activity modification and injections we discussed proceeding with a shoulder arthroscopy. Discussed the  procedure in detail including the risks and benefits of improvement in symptoms, worsening of their symptoms, no changes in her symptoms. I discussed decompressing the subacromial space by a bursectomy and CA ligament release as well as acromioplasty if needed. Additionally discussed evaluating the labrum and biceps if appropriate. In addition I discussed the possibility that if an occult tear of the rotator cuff is noted that it may require a mini open rotator cuff repair. In addition discussed the outpatient procedure. Follow-up in 2 weeks. Pendulum exercises and appropriate postoperative analgesics.  No history of DVT PE or MRSA. He is able to utilize. Postoperative aspirin and oxycodone.  I feel he can be out of work for 2 weeks return to work in 2 weeks no use of the left arm. He will be at [facility] 6 weeks following the surgery.  Will have him undergo preoperative clearance. And then also authorizations from his insurance company.   Plan left shoulder scope, SAD, possible claviculoplasty  Cecilie Kicks, PA-C for Dr Tonita Cong 09/30/2022, 8:51 AM

## 2022-09-30 NOTE — H&P (View-Only) (Signed)
David Whitaker is an 62 y.o. male.   Chief Complaint: left shoulder pain HPI: Reason for Visit: (normal) visit for: (left shoulder) Context: work injury; 5 months 03/23/22 Location (Upper Extremity): shoulder pain on the left Severity: pain level 2/10; mild Quality: sharp; aching Aggravating Factors: pushing/pulling ; lifting Alleviating Factors: PT/OT Are you working? modified duty (5lb. lifting and no repetitive use of left arm) Medications: helping a little; meloxicam Some pain in the left shoulder when he reaches out. Still on light duty ferret therapy has not helped. He had 2 to 3 hours of relief from the subacromial injection but not any sustained relief with the cortisone portion of the  Still with mild midthoracic pain  Past Medical History:  Diagnosis Date   Gallstones     No past surgical history on file.  Family History  Problem Relation Age of Onset   Diabetes Mother    Heart failure Mother    Cancer Mother    Diabetes Father    Heart failure Father    Social History:  reports that he has never smoked. He has never used smokeless tobacco. He reports current alcohol use. He reports that he does not use drugs.  Allergies: Not on File  Current meds: meloxicam  Review of Systems  Constitutional: Negative.   HENT: Negative.    Eyes: Negative.   Respiratory: Negative.    Cardiovascular: Negative.   Gastrointestinal: Negative.   Endocrine: Negative.   Genitourinary: Negative.   Musculoskeletal:  Positive for arthralgias and joint swelling.  Skin: Negative.   Neurological: Negative.   Psychiatric/Behavioral: Negative.      There were no vitals taken for this visit. Physical Exam Constitutional:      Appearance: Normal appearance.  HENT:     Head: Normocephalic.     Right Ear: External ear normal.     Left Ear: External ear normal.     Nose: Nose normal.     Mouth/Throat:     Pharynx: Oropharynx is clear.  Eyes:     Conjunctiva/sclera: Conjunctivae  normal.  Cardiovascular:     Rate and Rhythm: Normal rate and regular rhythm.     Pulses: Normal pulses.  Pulmonary:     Effort: Pulmonary effort is normal.  Abdominal:     General: Bowel sounds are normal.  Musculoskeletal:     Cervical back: Normal range of motion.     Comments: Constitutional: General Appearance: healthy-appearing and NAD.  Psychiatric: Mood and Affect: normal mood and affect.  Cardiovascular System: Arterial Pulses Left: radial normal and brachial normal. Edema Left: none. Varicosities Right: no varicosities. Varicosities Left: no varicosities.  C-Spine/Neck: Active Range of Motion: flexion normal, extension normal, and no pain elicited on motion.  Shoulders: Inspection Left: no misalignment, atrophy, erythema, swelling, or scapular winging. Bony Palpation Left: no tenderness of the sternoclavicular joint, the coracoid process, the acromioclavicular joint, the bicipital groove, or the scapula. Soft Tissue Palpation Left: no tenderness of the infraspinatus, the teres minor, the subacromial bursa, the axilla, the glenohumeral joint region, the pectoralis major insertion, the sternocleidomastoid, the costochondral junction, the trapezius, the rhomboid, the latissimus dorsi, the serratus, the deltoid, the levator scapulae, or the lateral cuff insertion and tenderness of the supraspinatus and the subdeltoid bursa. Active Range of Motion Left: limited. Special Tests Left: Speed's test negative and Neer's test positive. Stability Left: no laxity, sulcus sign negative, and anterior apprehension test negative. Strength Left: abduction 5/5, adduction 5/5, flexion 5/5, and extension 5/5.  Skin: Left Upper   Extremity: normal.  Neurological System: Biceps Reflex Left: normal (2). Brachioradialis Reflex Left: normal (2). Triceps Reflex Left: normal (2). Sensation on the Left: C5 normal, C6 normal, and C7 normal.  He is nontender over the AC joint.  Skin:    General: Skin is warm and  dry.  Neurological:     Mental Status: He is alert.    Three-view x-rays of the shoulder demonstrate AC arthrosis. Type II acromion. Located glenohumeral joint.  Outside MRI independently reviewed by myself demonstrates tendinosis of the supraspinatus mild fraying of the rotator cuff. Moderately severe arthrosis AC joint. Edema in the subdeltoid bursa.  X-rays of the thoracic spine report outside indicated no fracture. A mild dextrorotatory scoliosis. Multilevel anterior osteophytic spurring  Assessment/Plan Impression:  1. Left shoulder strain/contusion 2. 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 Thoracic strain with aggravation of his underlying thoracic spondylosis  Plan:  We discussed options at this point in time which would be to currently live with his symptoms and have him undergo a functional capacity exam. Versus a repeat subacromial injection. Other option would be an arthroscopic subacromial decompression. He reports his symptoms is over 5 months in duration. No help from his physical therapy and he has significant restriction in his ability to reach and lift.  Again we discussed activity modification strategies to avoid reinjury as well as impingement. He is no pain from his AC joint.  We discussed surgical intervention which would basically be a shoulder arthroscopy and subacromial decompression and bursectomy. Possible clavicular plasty if the clavicle was involved with the impingement.  Again his other option is to live with his symptoms. He would like to proceed with the surgery  I had an extensive discussion with the patient concerning her pathology and relevant anatomy. To the persistence of their symptoms despite conservative treatment to include an exercise program, activity modification and injections we discussed proceeding with a shoulder arthroscopy. Discussed the  procedure in detail including the risks and benefits of improvement in symptoms, worsening of their symptoms, no changes in her symptoms. I discussed decompressing the subacromial space by a bursectomy and CA ligament release as well as acromioplasty if needed. Additionally discussed evaluating the labrum and biceps if appropriate. In addition I discussed the possibility that if an occult tear of the rotator cuff is noted that it may require a mini open rotator cuff repair. In addition discussed the outpatient procedure. Follow-up in 2 weeks. Pendulum exercises and appropriate postoperative analgesics.  No history of DVT PE or MRSA. He is able to utilize. Postoperative aspirin and oxycodone.  I feel he can be out of work for 2 weeks return to work in 2 weeks no use of the left arm. He will be at [facility] 6 weeks following the surgery.  Will have him undergo preoperative clearance. And then also authorizations from his insurance company.   Plan left shoulder scope, SAD, possible claviculoplasty  Terez Freimark M Trinita Devlin, PA-C for Dr Beane 09/30/2022, 8:51 AM    

## 2022-10-13 NOTE — Patient Instructions (Addendum)
SURGICAL WAITING ROOM VISITATION Patients having surgery or a procedure may have no more than 2 support people in the waiting area - these visitors may rotate.    Children under the age of 64 must have an adult with them who is not the patient.  If the patient needs to stay at the hospital during part of their recovery, the visitor guidelines for inpatient rooms apply. Pre-op nurse will coordinate an appropriate time for 1 support person to accompany patient in pre-op.  This support person may not rotate.    Please refer to the [facility] website for the visitor guidelines for Inpatients (after your surgery is over and you are in a regular room).       Your procedure is scheduled on: 10-19-22   [facility]    Report to admitting at 6:15 AM   Call this number if you have problems the morning of surgery [phone]   Do not eat food or drink liquids :After Midnight.           If you have questions, please contact your surgeon's office.   FOLLOW  ANY ADDITIONAL PRE OP INSTRUCTIONS YOU RECEIVED FROM YOUR SURGEON'S OFFICE!!!     Oral Hygiene is also important to reduce your risk of infection.                                    Remember - BRUSH YOUR TEETH THE MORNING OF SURGERY WITH YOUR REGULAR TOOTHPASTE   Do NOT smoke after Midnight   Take these medicines the morning of surgery with A SIP OF WATER:  Omeprazole                              You may not have any metal on your body including  jewelry, and body piercing             Do not wear  lotions, powders, cologne, or deodorant              Men may shave face and neck.   Do not bring valuables to the hospital. [facility] IS NOT RESPONSIBLE   FOR VALUABLES.   Contacts, dentures or bridgework may not be worn into surgery.  DO NOT BRING YOUR HOME MEDICATIONS TO THE HOSPITAL. PHARMACY WILL DISPENSE MEDICATIONS LISTED ON YOUR MEDICATION LIST TO YOU DURING YOUR ADMISSION IN THE  HOSPITAL!    Patients discharged on the day of surgery will not be allowed to drive home.  Someone NEEDS to stay with you for the first 24 hours after anesthesia.               Please read over the following fact sheets you were given: IF YOU HAVE QUESTIONS ABOUT YOUR PRE-OP INSTRUCTIONS PLEASE CALL [phone] Gwen  If you received a COVID test during your pre-op visit  it is requested that you wear a mask when out in public, stay away from anyone that may not be feeling well and notify your surgeon if you develop symptoms. If you test positive for Covid or have been in contact with anyone that has tested positive in the last 10 days please notify you surgeon.  [facility] - Preparing for Surgery Before surgery, you can play an important role.  Because skin is not sterile, your skin needs to be as free of  germs as possible.  You can reduce the number of germs on your skin by washing with CHG (chlorahexidine gluconate) soap before surgery.  CHG is an antiseptic cleaner which kills germs and bonds with the skin to continue killing germs even after washing. Please DO NOT use if you have an allergy to CHG or antibacterial soaps.  If your skin becomes reddened/irritated stop using the CHG and inform your nurse when you arrive at Short Stay. Do not shave (including legs and underarms) for at least 48 hours prior to the first CHG shower.  You may shave your face/neck.  Please follow these instructions carefully:  1.  Shower with CHG Soap the night before surgery and the  morning of surgery.  2.  If you choose to wash your hair, wash your hair first as usual with your normal  shampoo.  3.  After you shampoo, rinse your hair and body thoroughly to remove the shampoo.                             4.  Use CHG as you would any other liquid soap.  You can apply chg directly to the skin and wash.  Gently with a scrungie or clean washcloth.  5.  Apply the CHG Soap to your body ONLY FROM THE NECK DOWN.   Do    not use on face/ open                           Wound or open sores. Avoid contact with eyes, ears mouth and   genitals (private parts).                       Wash face,  Genitals (private parts) with your normal soap.             6.  Wash thoroughly, paying special attention to the area where your    surgery  will be performed.  7.  Thoroughly rinse your body with warm water from the neck down.  8.  DO NOT shower/wash with your normal soap after using and rinsing off the CHG Soap.                9.  Pat yourself dry with a clean towel.            10.  Wear clean pajamas.            11.  Place clean sheets on your bed the night of your first shower and do not  sleep with pets. Day of Surgery : Do not apply any lotions/deodorants the morning of surgery.  Please wear clean clothes to the hospital/surgery center.  FAILURE TO FOLLOW THESE INSTRUCTIONS MAY RESULT IN THE CANCELLATION OF YOUR SURGERY  PATIENT SIGNATURE_________________________________  NURSE SIGNATURE__________________________________  ________________________________________________________________________

## 2022-10-13 NOTE — Progress Notes (Addendum)
COVID Vaccine Completed:  No  Date of COVID positive in last 90 days:  February 2024  PCP - [facility] Cardiologist - Belva Crome, MD (last OV 2018)  Chest x-ray - N/A EKG - 09-15-22 (copy on chart) Stress Test - 08-30-16 [facility] ECHO - N/A Cardiac Cath - N/A Pacemaker/ICD device last checked: Spinal Cord Stimulator: N/A  Bowel Prep -  N/A  Sleep Study - N/A CPAP -   Prediabetes Fasting Blood Sugar - 152 at PAT Checks Blood Sugar - does not check   Last dose of GLP1 agonist-  N/A GLP1 instructions:  N/A   Last dose of SGLT-2 inhibitors-  N/A SGLT-2 instructions: N/A  Blood Thinner Instructions:  N/A Aspirin Instructions: Last Dose:  Activity level:  Can go up a flight of stairs and perform activities of daily living without stopping and without symptoms of chest pain or shortness of breath.  Anesthesia review: Evaluated for chest pain by cardiology in 2018.  Patient denies any recent episodes of chest pain  Stop Bang 5  Patient denies shortness of breath, fever, cough and chest pain at PAT appointment  Patient verbalized understanding of instructions that were given to them at the PAT appointment. Patient was also instructed that they will need to review over the PAT instructions again at home before surgery.

## 2022-10-14 ENCOUNTER — Other Ambulatory Visit: Payer: Self-pay

## 2022-10-14 ENCOUNTER — Encounter (HOSPITAL_COMMUNITY)
Admission: RE | Admit: 2022-10-14 | Discharge: 2022-10-14 | Disposition: A | Discharge status: Home / Self Care | Payer: PRIVATE HEALTH INSURANCE | Source: Ambulatory Visit | Attending: Specialist | Admitting: Specialist

## 2022-10-14 ENCOUNTER — Encounter (HOSPITAL_COMMUNITY): Payer: Self-pay

## 2022-10-14 VITALS — BP 150/83 | HR 87 | Temp 98.0°F | Resp 16 | Ht 70.0 in | Wt 228.2 lb

## 2022-10-14 DIAGNOSIS — R7303 Prediabetes: Secondary | ICD-10-CM | POA: Insufficient documentation

## 2022-10-14 DIAGNOSIS — I10 Essential (primary) hypertension: Secondary | ICD-10-CM | POA: Diagnosis not present

## 2022-10-14 DIAGNOSIS — Z01812 Encounter for preprocedural laboratory examination: Secondary | ICD-10-CM | POA: Diagnosis present

## 2022-10-14 HISTORY — DX: Prediabetes: R73.03

## 2022-10-14 HISTORY — DX: Unspecified osteoarthritis, unspecified site: M19.90

## 2022-10-14 HISTORY — DX: Personal history of urinary calculi: Z87.442

## 2022-10-14 HISTORY — DX: Gastro-esophageal reflux disease without esophagitis: K21.9

## 2022-10-14 HISTORY — DX: Essential (primary) hypertension: I10

## 2022-10-14 LAB — BASIC METABOLIC PANEL
Anion gap: 8 (ref 5–15)
BUN: 15 mg/dL (ref 8–23)
CO2: 25 mmol/L (ref 22–32)
Calcium: 9 mg/dL (ref 8.9–10.3)
Chloride: 106 mmol/L (ref 98–111)
Creatinine, Ser: 0.99 mg/dL (ref 0.61–1.24)
GFR, Estimated: >60 mL/min (ref >60)
Glucose, Bld: 107 mg/dL — ABNORMAL HIGH (ref 70–99)
Potassium: 3.7 mmol/L (ref 3.5–5.1)
Sodium: 139 mmol/L (ref 135–145)

## 2022-10-14 LAB — GLUCOSE, CAPILLARY: Glucose-Capillary: 152 mg/dL — ABNORMAL HIGH (ref 70–99)

## 2022-10-14 NOTE — Progress Notes (Signed)
   10/14/22 1407  OBSTRUCTIVE SLEEP APNEA  Have you ever been diagnosed with sleep apnea through a sleep study? No  Do you snore loudly (loud enough to be heard through closed doors)?  1  Do you often feel tired, fatigued, or sleepy during the daytime (such as falling asleep during driving or talking to someone)? 0  Has anyone observed you stop breathing during your sleep? 1  Do you have, or are you being treated for high blood pressure? 1  BMI more than 35 kg/m2? 0  Age > 50 (1-yes) 1  Neck circumference greater than:Male 16 inches or larger, Male 17inches or larger? 0  Male Gender (Yes=1) 1  Obstructive Sleep Apnea Score 5  Score 5 or greater  Results sent to PCP

## 2022-10-15 LAB — HEMOGLOBIN A1C
Hgb A1c MFr Bld: 5.8 % — ABNORMAL HIGH (ref 4.8–5.6)
Mean Plasma Glucose: 120 mg/dL

## 2022-10-19 ENCOUNTER — Encounter (HOSPITAL_COMMUNITY)
Admission: RE | Disposition: A | Discharge status: Home / Self Care | Payer: Self-pay | Source: Home / Self Care | Attending: Specialist

## 2022-10-19 ENCOUNTER — Ambulatory Visit (HOSPITAL_COMMUNITY)
Admission: RE | Admit: 2022-10-19 | Discharge: 2022-10-19 | Disposition: A | Discharge status: Home / Self Care | Payer: PRIVATE HEALTH INSURANCE | Attending: Specialist | Admitting: Specialist

## 2022-10-19 ENCOUNTER — Ambulatory Visit (HOSPITAL_BASED_OUTPATIENT_CLINIC_OR_DEPARTMENT_OTHER): Payer: PRIVATE HEALTH INSURANCE | Admitting: Anesthesiology

## 2022-10-19 ENCOUNTER — Encounter (HOSPITAL_COMMUNITY): Payer: Self-pay | Admitting: Specialist

## 2022-10-19 ENCOUNTER — Other Ambulatory Visit: Payer: Self-pay

## 2022-10-19 ENCOUNTER — Ambulatory Visit (HOSPITAL_COMMUNITY): Payer: PRIVATE HEALTH INSURANCE | Admitting: Physician Assistant

## 2022-10-19 DIAGNOSIS — Z09 Encounter for follow-up examination after completed treatment for conditions other than malignant neoplasm: Secondary | ICD-10-CM | POA: Insufficient documentation

## 2022-10-19 DIAGNOSIS — M47814 Spondylosis without myelopathy or radiculopathy, thoracic region: Secondary | ICD-10-CM | POA: Diagnosis not present

## 2022-10-19 DIAGNOSIS — S29012A Strain of muscle and tendon of back wall of thorax, initial encounter: Secondary | ICD-10-CM | POA: Insufficient documentation

## 2022-10-19 DIAGNOSIS — M7522 Bicipital tendinitis, left shoulder: Secondary | ICD-10-CM

## 2022-10-19 DIAGNOSIS — M7542 Impingement syndrome of left shoulder: Secondary | ICD-10-CM

## 2022-10-19 DIAGNOSIS — Z87891 Personal history of nicotine dependence: Secondary | ICD-10-CM | POA: Diagnosis not present

## 2022-10-19 DIAGNOSIS — X58XXXA Exposure to other specified factors, initial encounter: Secondary | ICD-10-CM | POA: Diagnosis not present

## 2022-10-19 DIAGNOSIS — S46912A Strain of unspecified muscle, fascia and tendon at shoulder and upper arm level, left arm, initial encounter: Secondary | ICD-10-CM | POA: Diagnosis not present

## 2022-10-19 DIAGNOSIS — Z79899 Other long term (current) drug therapy: Secondary | ICD-10-CM | POA: Diagnosis not present

## 2022-10-19 DIAGNOSIS — S43432A Superior glenoid labrum lesion of left shoulder, initial encounter: Secondary | ICD-10-CM | POA: Diagnosis not present

## 2022-10-19 DIAGNOSIS — I10 Essential (primary) hypertension: Secondary | ICD-10-CM | POA: Diagnosis not present

## 2022-10-19 DIAGNOSIS — S46212A Strain of muscle, fascia and tendon of other parts of biceps, left arm, initial encounter: Secondary | ICD-10-CM

## 2022-10-19 DIAGNOSIS — M19012 Primary osteoarthritis, left shoulder: Secondary | ICD-10-CM | POA: Insufficient documentation

## 2022-10-19 HISTORY — PX: SHOULDER ARTHROSCOPY WITH SUBACROMIAL DECOMPRESSION: SHX5684

## 2022-10-19 LAB — GLUCOSE, CAPILLARY: Glucose-Capillary: 126 mg/dL — ABNORMAL HIGH (ref 70–99)

## 2022-10-19 SURGERY — SHOULDER ARTHROSCOPY WITH SUBACROMIAL DECOMPRESSION
Anesthesia: Regional | Site: Shoulder | Laterality: Left

## 2022-10-19 MED ORDER — PROPOFOL 10 MG/ML IV BOLUS
INTRAVENOUS | Status: DC | PRN
  Administered 2022-10-19: 150 mg via INTRAVENOUS

## 2022-10-19 MED ORDER — LACTATED RINGERS IV SOLN: INTRAVENOUS | Status: DC

## 2022-10-19 MED ORDER — BUPIVACAINE LIPOSOME 1.3 % IJ SUSP
INTRAMUSCULAR | Status: DC | PRN
  Administered 2022-10-19: 10 mL via PERINEURAL

## 2022-10-19 MED ORDER — MIDAZOLAM HCL 2 MG/2ML IJ SOLN
INTRAMUSCULAR | Status: AC
  Filled 2022-10-19: qty 2

## 2022-10-19 MED ORDER — POLYETHYLENE GLYCOL 3350 17 G PO PACK: 17.0000 g | PACK | Freq: Every day | ORAL | 0 refills | Status: DC

## 2022-10-19 MED ORDER — BUPIVACAINE HCL (PF) 0.5 % IJ SOLN
INTRAMUSCULAR | Status: AC
  Filled 2022-10-19: qty 30

## 2022-10-19 MED ORDER — DOCUSATE SODIUM 100 MG PO CAPS: 100.0000 mg | ORAL_CAPSULE | Freq: Two times a day (BID) | ORAL | 1 refills | Status: DC | PRN

## 2022-10-19 MED ORDER — ROCURONIUM BROMIDE 10 MG/ML (PF) SYRINGE
PREFILLED_SYRINGE | INTRAVENOUS | Status: DC | PRN
  Administered 2022-10-19: 100 mg via INTRAVENOUS

## 2022-10-19 MED ORDER — SUGAMMADEX SODIUM 200 MG/2ML IV SOLN
INTRAVENOUS | Status: DC | PRN
  Administered 2022-10-19: 300 mg via INTRAVENOUS

## 2022-10-19 MED ORDER — FENTANYL CITRATE (PF) 100 MCG/2ML IJ SOLN
INTRAMUSCULAR | Status: AC
  Filled 2022-10-19: qty 2

## 2022-10-19 MED ORDER — ONDANSETRON HCL 4 MG/2ML IJ SOLN
INTRAMUSCULAR | Status: DC | PRN
  Administered 2022-10-19: 4 mg via INTRAVENOUS

## 2022-10-19 MED ORDER — ORAL CARE MOUTH RINSE: 15.0000 mL | Freq: Once | OROMUCOSAL | Status: AC

## 2022-10-19 MED ORDER — PROMETHAZINE HCL 25 MG/ML IJ SOLN: 6.2500 mg | INTRAMUSCULAR | Status: DC | PRN

## 2022-10-19 MED ORDER — ASPIRIN 81 MG PO TBEC: 81.0000 mg | DELAYED_RELEASE_TABLET | Freq: Two times a day (BID) | ORAL | 1 refills | Status: DC

## 2022-10-19 MED ORDER — ROCURONIUM BROMIDE 10 MG/ML (PF) SYRINGE
PREFILLED_SYRINGE | INTRAVENOUS | Status: AC
  Filled 2022-10-19: qty 10

## 2022-10-19 MED ORDER — MIDAZOLAM HCL 5 MG/5ML IJ SOLN
INTRAMUSCULAR | Status: DC | PRN
  Administered 2022-10-19 (×2): 1 mg via INTRAVENOUS

## 2022-10-19 MED ORDER — EPINEPHRINE PF 1 MG/ML IJ SOLN
INTRAMUSCULAR | Status: AC
  Filled 2022-10-19: qty 1

## 2022-10-19 MED ORDER — BUPIVACAINE HCL (PF) 0.5 % IJ SOLN
INTRAMUSCULAR | Status: DC | PRN
  Administered 2022-10-19: 20 mL via PERINEURAL

## 2022-10-19 MED ORDER — OXYCODONE HCL 10 MG PO TABS: 10.0000 mg | ORAL_TABLET | ORAL | 0 refills | Status: AC | PRN

## 2022-10-19 MED ORDER — SODIUM CHLORIDE 0.9 % IR SOLN
Status: DC | PRN
  Administered 2022-10-19 (×3): 3000 mL

## 2022-10-19 MED ORDER — FENTANYL CITRATE (PF) 100 MCG/2ML IJ SOLN
INTRAMUSCULAR | Status: DC | PRN
  Administered 2022-10-19 (×2): 50 ug via INTRAVENOUS

## 2022-10-19 MED ORDER — AMISULPRIDE (ANTIEMETIC) 5 MG/2ML IV SOLN: 10.0000 mg | Freq: Once | INTRAVENOUS | Status: DC | PRN

## 2022-10-19 MED ORDER — DEXAMETHASONE SODIUM PHOSPHATE 10 MG/ML IJ SOLN
INTRAMUSCULAR | Status: DC | PRN
  Administered 2022-10-19: 10 mg via INTRAVENOUS

## 2022-10-19 MED ORDER — DEXAMETHASONE SODIUM PHOSPHATE 10 MG/ML IJ SOLN
INTRAMUSCULAR | Status: AC
  Filled 2022-10-19: qty 1

## 2022-10-19 MED ORDER — BUPIVACAINE-EPINEPHRINE (PF) 0.5% -1:200000 IJ SOLN
INTRAMUSCULAR | Status: AC
  Filled 2022-10-19: qty 30

## 2022-10-19 MED ORDER — CHLORHEXIDINE GLUCONATE 0.12 % MT SOLN
15.0000 mL | Freq: Once | OROMUCOSAL | Status: AC
  Administered 2022-10-19: 15 mL via OROMUCOSAL

## 2022-10-19 MED ORDER — PROPOFOL 10 MG/ML IV BOLUS
INTRAVENOUS | Status: AC
  Filled 2022-10-19: qty 20

## 2022-10-19 MED ORDER — MEPERIDINE HCL 50 MG/ML IJ SOLN: 6.2500 mg | INTRAMUSCULAR | Status: DC | PRN

## 2022-10-19 MED ORDER — ONDANSETRON HCL 4 MG/2ML IJ SOLN
INTRAMUSCULAR | Status: AC
  Filled 2022-10-19: qty 2

## 2022-10-19 MED ORDER — EPINEPHRINE PF 1 MG/ML IJ SOLN
INTRAMUSCULAR | Status: AC
  Filled 2022-10-19: qty 2

## 2022-10-19 MED ORDER — CEFAZOLIN SODIUM-DEXTROSE 2-4 GM/100ML-% IV SOLN
2.0000 g | INTRAVENOUS | Status: AC
  Administered 2022-10-19: 2 g via INTRAVENOUS
  Filled 2022-10-19: qty 100

## 2022-10-19 MED ORDER — BUPIVACAINE-EPINEPHRINE (PF) 0.5% -1:200000 IJ SOLN
INTRAMUSCULAR | Status: DC | PRN
  Administered 2022-10-19: 6 mL

## 2022-10-19 MED ORDER — EPINEPHRINE PF 1 MG/ML IJ SOLN
INTRAMUSCULAR | Status: DC | PRN
  Administered 2022-10-19 (×3): 1 mg

## 2022-10-19 MED ORDER — OXYCODONE HCL 5 MG/5ML PO SOLN: 5.0000 mg | Freq: Once | ORAL | Status: DC | PRN

## 2022-10-19 MED ORDER — OXYCODONE HCL 5 MG PO TABS: 5.0000 mg | ORAL_TABLET | Freq: Once | ORAL | Status: DC | PRN

## 2022-10-19 MED ORDER — LIDOCAINE HCL (PF) 2 % IJ SOLN
INTRAMUSCULAR | Status: AC
  Filled 2022-10-19: qty 5

## 2022-10-19 MED ORDER — LIDOCAINE 2% (20 MG/ML) 5 ML SYRINGE
INTRAMUSCULAR | Status: DC | PRN
  Administered 2022-10-19: 40 mg via INTRAVENOUS

## 2022-10-19 MED ORDER — TRANEXAMIC ACID-NACL 1000-0.7 MG/100ML-% IV SOLN
1000.0000 mg | INTRAVENOUS | Status: AC
  Administered 2022-10-19: 1000 mg via INTRAVENOUS
  Filled 2022-10-19: qty 100

## 2022-10-19 MED ORDER — PHENYLEPHRINE HCL-NACL 20-0.9 MG/250ML-% IV SOLN
INTRAVENOUS | Status: DC | PRN
  Administered 2022-10-19: 50 ug/min via INTRAVENOUS

## 2022-10-19 MED ORDER — HYDROMORPHONE HCL 1 MG/ML IJ SOLN: 0.2500 mg | INTRAMUSCULAR | Status: DC | PRN

## 2022-10-19 MED ORDER — EPHEDRINE SULFATE-NACL 50-0.9 MG/10ML-% IV SOSY
PREFILLED_SYRINGE | INTRAVENOUS | Status: DC | PRN
  Administered 2022-10-19: 5 mg via INTRAVENOUS
  Administered 2022-10-19: 10 mg via INTRAVENOUS
  Administered 2022-10-19: 5 mg via INTRAVENOUS

## 2022-10-19 SURGICAL SUPPLY — 66 items
AID PSTN UNV HD RSTRNT DISP (MISCELLANEOUS) ×1
ANCHOR NDL 9/16 CIR SZ 8 (NEEDLE) IMPLANT
ANCHOR NEEDLE 9/16 CIR SZ 8 (NEEDLE) IMPLANT
BAG COUNTER SPONGE SURGICOUNT (BAG) IMPLANT
BAG SPNG CNTER NS LX DISP (BAG)
BLADE SHAVER TORPEDO 4X13 (MISCELLANEOUS) ×1 IMPLANT
BLADE SURG SZ11 CARB STEEL (BLADE) ×1 IMPLANT
BURR OVAL 8 FLU 4.0X13 (MISCELLANEOUS) IMPLANT
CANNULA 5.75X7 CRYSTAL CLEAR (CANNULA) IMPLANT
CANNULA ACUFO 5X76 (CANNULA) IMPLANT
CLEANER TIP ELECTROSURG 2X2 (MISCELLANEOUS) IMPLANT
COVER SURGICAL LIGHT HANDLE (MISCELLANEOUS) ×1 IMPLANT
DRAPE IMP U-DRAPE 54X76 (DRAPES) ×1 IMPLANT
DRAPE ORTHO SPLIT 77X108 STRL (DRAPES) ×2
DRAPE POUCH INSTRU U-SHP 10X18 (DRAPES) ×1 IMPLANT
DRAPE STERI 35X30 U-POUCH (DRAPES) ×1 IMPLANT
DRAPE SURG ORHT 6 SPLT 77X108 (DRAPES) ×2 IMPLANT
DRSG AQUACEL AG ADV 3.5X 4 (GAUZE/BANDAGES/DRESSINGS) IMPLANT
DRSG AQUACEL AG ADV 3.5X 6 (GAUZE/BANDAGES/DRESSINGS) IMPLANT
DURAPREP 26ML APPLICATOR (WOUND CARE) ×1 IMPLANT
DW OUTFLOW CASSETTE/TUBE SET (MISCELLANEOUS) ×1 IMPLANT
ELECT NDL TIP 2.8 STRL (NEEDLE) ×1 IMPLANT
ELECT NEEDLE TIP 2.8 STRL (NEEDLE) ×1 IMPLANT
ELECT REM PT RETURN 15FT ADLT (MISCELLANEOUS) ×1 IMPLANT
FILTER STRAW (MISCELLANEOUS) ×1 IMPLANT
GAUZE PAD ABD 8X10 STRL (GAUZE/BANDAGES/DRESSINGS) IMPLANT
GAUZE SPONGE 4X4 12PLY STRL (GAUZE/BANDAGES/DRESSINGS) IMPLANT
GLOVE BIOGEL PI IND STRL 7.0 (GLOVE) ×1 IMPLANT
GLOVE BIOGEL PI IND STRL 8 (GLOVE) ×1 IMPLANT
GLOVE SURG SS PI 8.0 STRL IVOR (GLOVE) ×2 IMPLANT
GOWN STRL REUS W/ TWL XL LVL3 (GOWN DISPOSABLE) ×2 IMPLANT
GOWN STRL REUS W/TWL XL LVL3 (GOWN DISPOSABLE) ×2
KIT BASIN OR (CUSTOM PROCEDURE TRAY) ×1 IMPLANT
KIT TURNOVER KIT A (KITS) IMPLANT
MANIFOLD NEPTUNE II (INSTRUMENTS) ×1 IMPLANT
NDL SCORPION MULTI FIRE (NEEDLE) IMPLANT
NDL SPNL 18GX3.5 QUINCKE PK (NEEDLE) ×1 IMPLANT
NEEDLE SCORPION MULTI FIRE (NEEDLE) IMPLANT
NEEDLE SPNL 18GX3.5 QUINCKE PK (NEEDLE) ×1 IMPLANT
PACK SHOULDER (CUSTOM PROCEDURE TRAY) ×1 IMPLANT
PORT APPOLLO RF 90DEGREE MULTI (SURGICAL WAND) ×1 IMPLANT
PROTECTOR NERVE ULNAR (MISCELLANEOUS) ×1 IMPLANT
RESTRAINT HEAD UNIVERSAL NS (MISCELLANEOUS) ×1 IMPLANT
SLING ARM IMMOBILIZER LRG (SOFTGOODS) IMPLANT
SLING ARM IMMOBILIZER MED (SOFTGOODS) IMPLANT
SLING ULTRA II L (ORTHOPEDIC SUPPLIES) IMPLANT
STRIP CLOSURE SKIN 1/2X4 (GAUZE/BANDAGES/DRESSINGS) IMPLANT
SUCTION FRAZIER HANDLE 12FR (TUBING) ×1
SUCTION TUBE FRAZIER 12FR DISP (TUBING) ×1 IMPLANT
SUT ETHIBOND NAB CT1 #1 30IN (SUTURE) IMPLANT
SUT ETHILON 4 0 PS 2 18 (SUTURE) ×1 IMPLANT
SUT FIBERWIRE #2 38 T-5 BLUE (SUTURE)
SUT PROLENE 3 0 PS 2 (SUTURE) ×1 IMPLANT
SUT TIGER TAPE 7 IN WHITE (SUTURE) IMPLANT
SUT VIC AB 0 CT1 27 (SUTURE) ×1
SUT VIC AB 0 CT1 27XBRD ANTBC (SUTURE) ×1 IMPLANT
SUT VIC AB 1-0 CT2 27 (SUTURE) IMPLANT
SUT VIC AB 2-0 CT2 27 (SUTURE) IMPLANT
SUT VICRYL 0 UR6 27IN ABS (SUTURE) ×1 IMPLANT
SUTURE FIBERWR #2 38 T-5 BLUE (SUTURE) IMPLANT
SYR 20ML LL LF (SYRINGE) ×1 IMPLANT
TAPE FIBER 2MM 7IN #2 BLUE (SUTURE) IMPLANT
TOWEL OR 17X26 10 PK STRL BLUE (TOWEL DISPOSABLE) ×1 IMPLANT
TUBING ARTHROSCOPY IRRIG 16FT (MISCELLANEOUS) ×1 IMPLANT
TUBING CONNECTING 10 (TUBING) ×2 IMPLANT
WIPE CHG 2% PREP (PERSONAL CARE ITEMS) ×1 IMPLANT

## 2022-10-19 NOTE — Anesthesia Postprocedure Evaluation (Signed)
Anesthesia Post Note  Patient: David Whitaker  Procedure(s) Performed: SHOULDER ARTHROSCOPY WITH SUBACROMIAL DECOMPRESSION, CLAVICULOPLASTY, AND BICEPS TENOTOMY (Left: Shoulder)     Patient location during evaluation: PACU Anesthesia Type: Regional and General Level of consciousness: awake and alert Pain management: pain level controlled Vital Signs Assessment: post-procedure vital signs reviewed and stable Respiratory status: spontaneous breathing, nonlabored ventilation and respiratory function stable Cardiovascular status: blood pressure returned to baseline and stable Postop Assessment: no apparent nausea or vomiting Anesthetic complications: no   No notable events documented.  Last Vitals:  Vitals:   10/19/22 1056 10/19/22 1100  BP: (!) 146/92 (!) 146/92  Pulse: (!) 103 98  Resp:    Temp: (!) 36.4 C   SpO2: 98% 97%    Last Pain:  Vitals:   10/19/22 1055  TempSrc:   PainSc: 0-No pain                 Lowella Curb

## 2022-10-19 NOTE — Transfer of Care (Signed)
Immediate Anesthesia Transfer of Care Note  Patient: David Whitaker  Procedure(s) Performed: SHOULDER ARTHROSCOPY WITH SUBACROMIAL DECOMPRESSION, CLAVICULOPLASTY, AND BICEPS TENOTOMY (Left: Shoulder)  Patient Location: PACU  Anesthesia Type:General  Level of Consciousness: awake, alert , and oriented  Airway & Oxygen Therapy: Patient Spontanous Breathing and Patient connected to face mask oxygen  Post-op Assessment: Report given to RN and Post -op Vital signs reviewed and stable  Post vital signs: Reviewed and stable  Last Vitals:  Vitals Value Taken Time  BP 151/84 10/19/22 1007  Temp 36.3 C 10/19/22 1007  Pulse 96 10/19/22 1009  Resp 12 10/19/22 1009  SpO2 100 % 10/19/22 1009  Vitals shown include unvalidated device data.  Last Pain:  Vitals:   10/19/22 0715  TempSrc:   PainSc: 4       Patients Stated Pain Goal: 4 (10/19/22 0715)  Complications: No notable events documented.

## 2022-10-19 NOTE — Anesthesia Procedure Notes (Signed)
Procedure Name: Intubation Date/Time: 10/19/2022 8:38 AM  Performed by: Orest Dikes, CRNAPre-anesthesia Checklist: Patient identified, Emergency Drugs available, Suction available and Patient being monitored Patient Re-evaluated:Patient Re-evaluated prior to induction Oxygen Delivery Method: Circle system utilized Preoxygenation: Pre-oxygenation with 100% oxygen Induction Type: IV induction Ventilation: Mask ventilation without difficulty Laryngoscope Size: Mac and 4 Grade View: Grade I Tube type: Oral Tube size: 7.5 mm Number of attempts: 1 Airway Equipment and Method: Stylet Placement Confirmation: ETT inserted through vocal cords under direct vision, positive ETCO2 and breath sounds checked- equal and bilateral Secured at: 21 cm Tube secured with: Tape Dental Injury: Teeth and Oropharynx as per pre-operative assessment

## 2022-10-19 NOTE — Op Note (Signed)
NAMEBUNYAN, MOLE MEDICAL RECORD NO: [id number] ACCOUNT NO: [id number] DATE OF BIRTH: September 18, 1960 FACILITY: [facility] LOCATION: [facility] PHYSICIAN: Javier Docker, MD  Operative Report   DATE OF PROCEDURE: 10/19/2022  PREOPERATIVE DIAGNOSES: 1.  Impingement syndrome, left shoulder. 2.  Bicipital tendinopathy. 3.  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  POSTOPERATIVE DIAGNOSES:   1.  Impingement syndrome, left shoulder. 2.  Bicipital tendinopathy. 3.  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 4.  Tearing of the anterior, superior and posterior labrum and a high grade biceps tendon tear.  PROCEDURE PERFORMED:   1.  Left shoulder arthroscopy. 2.  Extensive debridement, anterior, superior and posterior labrum, tenotomy of the biceps tendon, subacromial decompression, CA ligament release, acromioplasty, distal clavicle resection.  ANESTHESIA:  General.  ASSISTANT:  Andrez Grime, PA.  HISTORY:  62 year old with persistent pain, shoulder following a work injury.  He had positive impingement sign on exam.  Anterior shoulder pain along the biceps tendon preoperatively.  Have posterior shoulder pain as well.  He was indicated for shoulder  arthroscopy, subacromial decompression, possible distal clavicle resection, clavicular plasty due to the inferior projected spur on the MRI leading to impingement.  Discussed risks and benefits including bleeding, infection, damage to neurovascular  structures, no change in symptoms, worsening symptoms, DVT, PE, anesthetic complications, etc.  TECHNIQUE:  With the patient in supine beach chair position, after induction of adequate general anesthesia, 2 grams Kefzol, left shoulder was examined under anesthesia and he had full range, left shoulder was then prepped and draped in the usual sterile  fashion.  A surgical marker utilized to delineate the acromion AC joint coracoid.  Standard posterolateral portal was utilized with incision through  skin only with an 11 blade.  With the arm in the 70/30 position with gentle traction applied we advanced  the arthroscopic camera in line with the coracoid and penetrating the capsule atraumatically.  Irrigant was utilized to insufflate the joint 35 mmHg.  Noted was extensive tearing of the biceps tendon longitudinal and horizontal.  There was a small grade  4 lesion of the anterior glenoid.  Significant tearing of the labrum anteriorly, superiorly and posteriorly. I fashioned anterior portal after local sedation with 18-gauge needle just to be beneath the biceps tendon in the rotator interval.  I advanced  then a cannula penetrating the capsule.  I then introduced the shaver and began debridement of the anterior, superior and posterior labrum as well as portion of the biceps tendon.  It was clear that the labrum was attenuated anteriorly and superiorly  torn.  There was some detachment.  It was not repairable.  I debrided to a stable base and I then used a combination of shaver and ArthroWand to perform a tenotomy of the remaining intact portion of the biceps tendon as it was felt to be a source of his  anterior shoulder pain.  Following this, I lavaged the joint and inspected the undersurface of the cuff and it was intact.  Glenoid was unremarkable.  Subscap was mild degenerative fraying of the superior aspect of it.  I then redirected the cannula and  camera in the subacromial space.  I triangulated with a new portal that was anterolaterally.  Triangulating the subacromial space and there was hypertrophic bursa and noted throughout the subacromial space.  I then introduced a shaver and performed a  full subdeltoid subacromial bursectomy.  There was an thickened CA ligament.  This was released with an ArthroWand.  There was a spur  at the [facility] joint, both on the acromial side and on the distal clavicle side.  I used a high-speed bur to remove the  acromial spur, exposing the distal clavicle.  This was  skeletonized with an ArthroWand inferiorly, anteriorly and posteriorly for approximately 1 cm.  I then introduced laterally the bur and burred the undersurface of the clavicle approximately to bring  it up to the acromion.  This was approximately 3 mm.  Following this, I made a separate anterior portal just in front of the [facility] joint and then placed the bur there and resected additional portion of the distal clavicle.  Following this, there was no  impingement on the supraspinatus.  The inferior clavicular spur was noted to be impinging upon the outlet.  Following this, we introduced the ArthroWand and achieved strict hemostasis.  We briefly elevated the intra-articular pressure to 45 to achieve  hemostasis and then reduced it.  We felt we had achieved strict hemostasis.  I checked posteriorly, anteriorly and laterally there was no tear of the rotator cuff of any significance.  Following this, there was improvement in the subacromial space, I  then removed all instrumentation after a copious lavage and closed the portals with 4-0 nylon simple sutures.  0.25% Marcaine with epinephrine was infiltrated in the joint.  Wound was dressed sterilely, placed in a sling, extubated without difficulty and  transported to the recovery room in satisfactory condition.  The patient tolerated the procedure well.  No complications.  ASSISTANT:  Andrez Grime, PA, was used throughout the case for patient positioning, holding traction on the arm, closure, etc.  BLOOD LOSS:  Minimal.   PUS D: 10/19/2022 10:12:39 am T: 10/19/2022 12:54:00 pm  JOB: [id number]/ [id number]

## 2022-10-19 NOTE — Interval H&P Note (Signed)
History and Physical Interval Note:  10/19/2022 8:18 AM  David Whitaker  has presented today for surgery, with the diagnosis of Left shoulder impingement syndrome.  The various methods of treatment have been discussed with the patient and family. After consideration of risks, benefits and other options for treatment, the patient has consented to  Procedure(s): SHOULDER ARTHROSCOPY WITH SUBACROMIAL DECOMPRESSION POSSIBLE CLAVICULOPLASTY (Left) as a surgical intervention.  The patient's history has been reviewed, patient examined, no change in status, stable for surgery.  I have reviewed the patient's chart and labs.  Questions were answered to the patient's satisfaction.     Javier Docker

## 2022-10-19 NOTE — Anesthesia Preprocedure Evaluation (Signed)
Anesthesia Evaluation  Patient identified by MRN, date of birth, ID band Patient awake    Reviewed: Allergy & Precautions, H&P , NPO status , Patient's Chart, lab work & pertinent test results  Airway Mallampati: II  TM Distance: >3 FB Neck ROM: Full    Dental no notable dental hx.    Pulmonary neg pulmonary ROS, former smoker   Pulmonary exam normal breath sounds clear to auscultation       Cardiovascular hypertension, Pt. on medications negative cardio ROS Normal cardiovascular exam Rhythm:Regular Rate:Normal     Neuro/Psych negative neurological ROS  negative psych ROS   GI/Hepatic Neg liver ROS,GERD  ,,  Endo/Other  negative endocrine ROS    Renal/GU negative Renal ROS  negative genitourinary   Musculoskeletal  (+) Arthritis , Osteoarthritis,    Abdominal  (+) + obese  Peds negative pediatric ROS (+)  Hematology negative hematology ROS (+)   Anesthesia Other Findings   Reproductive/Obstetrics negative OB ROS                             Anesthesia Physical Anesthesia Plan  ASA: 2  Anesthesia Plan: General and Regional   Post-op Pain Management: Regional block* and Minimal or no pain anticipated   Induction: Intravenous  PONV Risk Score and Plan: 2 and Ondansetron, Midazolam and Treatment may vary due to age or medical condition  Airway Management Planned: LMA  Additional Equipment:   Intra-op Plan:   Post-operative Plan: Extubation in OR  Informed Consent: I have reviewed the patients History and Physical, chart, labs and discussed the procedure including the risks, benefits and alternatives for the proposed anesthesia with the patient or authorized representative who has indicated his/her understanding and acceptance.     Dental advisory given  Plan Discussed with: CRNA  Anesthesia Plan Comments:        Anesthesia Quick Evaluation

## 2022-10-19 NOTE — Brief Op Note (Signed)
10/19/2022  8:19 AM  PATIENT:  David Whitaker  62 y.o. male  PRE-OPERATIVE DIAGNOSIS:  Left shoulder impingement syndrome  POST-OPERATIVE DIAGNOSIS:  Left shoulder impingement syndrome  PROCEDURE:  Procedure(s): SHOULDER ARTHROSCOPY WITH SUBACROMIAL DECOMPRESSION POSSIBLE CLAVICULOPLASTY (Left) DIAGNOSES: Left shoulder, biceps tendinitis, AC arthritis, and subacromial impingement.  POST-OPERATIVE DIAGNOSIS:  same, labral tear, biceps tear  PROCEDURE: Arthroscopic extensive debridement - 29823 Subdeltoid Bursa, Anterior Labrum, Superior Labrum, and Posterior Labrum Arthroscopic distal clavicle excision - 78676 Arthroscopic subacromial decompression - 72094 Biceps tenotomy   OPERATIVE FINDING: Exam under anesthesia: Normal Articular space:  Minor anterior grade 4 changes , tearing displaced eroded anterior superior and posterior labrum Chondral surfaces: Minor anterior grade 4 changes Biceps: Torn approximately 75% longitudinal and horizontal. Subscapularis: Intact minimal degeneration Supraspinatus: Intact minimal wear Infraspinatus: Intact minimal wear  SURGEON:  Surgeon(s) and Role:    Jene Every, MD - Primary  PHYSICIAN ASSISTANT:   ASSISTANTS: Bissell   ANESTHESIA:   general  EBL:  min   BLOOD ADMINISTERED:none  DRAINS: none   LOCAL MEDICATIONS USED:  MARCAINE     SPECIMEN:  No Specimen  DISPOSITION OF SPECIMEN:  N/A  COUNTS:  YES  TOURNIQUET:  * No tourniquets in log *  DICTATION: .Other Dictation: Dictation Number [id number]   PLAN OF CARE: Discharge to home after PACU  PATIENT DISPOSITION:  PACU - hemodynamically stable.   Delay start of Pharmacological VTE agent (>24hrs) due to surgical blood loss or risk of bleeding: no

## 2022-10-19 NOTE — Discharge Instructions (Signed)
SHOULDER ARTHROSCOPY POSTOPERATIVE INSTRUCTIONS FOR DR. BEANE ° °1.  Ice pack on shoulder 3-4 times per day. ° °2.  May remove bandages in 72 hours and apply band-aids to sutures. ° °3,  May get out of sling in AM and start gentle pendulum exercises.  You are encouraged       to move the elbow, wrist and hand. ° °4.  Elevate operative shoulder and elbow on pillow. ° °5.  Exercise your fingers to help reduce swelling. ° °6.  Report to your doctor should any of the following situations occur: ° ° -Swelling of your fingers. ° -Inability to wiggle your fingers. ° -Coldness, turning pale or blueness of your fingers. ° -Loss of sensation, numbness or tingling of your fingers. ° -Unusual small or odor from under dressing. ° -Excessive bleeding or drainage from the surgical site(s). ° -Severe pain which is not relieved by the pain medication your doctor prescribed                for you. ° °7.  Call the office to schedule and appointment for 10-14 days . °8.  Take one aspirin per day 325mg with a meal if not on another blood thinner or allergic. ° °Patient Signature:  ________________________________________________________ ° °Nurse's Signature:  ________________________________________________________ ° °

## 2022-10-19 NOTE — Anesthesia Procedure Notes (Signed)
Anesthesia Regional Block: Interscalene brachial plexus block   Pre-Anesthetic Checklist: , timeout performed,  Correct Patient, Correct Site, Correct Laterality,  Correct Procedure, Correct Position, site marked,  Risks and benefits discussed,  Surgical consent,  Pre-op evaluation,  At surgeon's request and post-op pain management  Laterality: Left  Prep: chloraprep       Needles:  Injection technique: Single-shot  Needle Type: Stimiplex     Needle Length: 9cm  Needle Gauge: 21     Additional Needles:   Procedures:,,,, ultrasound used (permanent image in chart),,    Narrative:  Start time: 10/19/2022 8:06 AM End time: 10/19/2022 8:11 AM Injection made incrementally with aspirations every 5 mL.  Performed by: Personally  Anesthesiologist: Lowella Curb, MD

## 2022-10-20 ENCOUNTER — Encounter (HOSPITAL_COMMUNITY): Payer: Self-pay | Admitting: Specialist

## 2023-10-20 ENCOUNTER — Other Ambulatory Visit: Payer: Self-pay

## 2023-10-20 DIAGNOSIS — Z87442 Personal history of urinary calculi: Secondary | ICD-10-CM | POA: Insufficient documentation

## 2023-10-20 DIAGNOSIS — Z6831 Body mass index (BMI) 31.0-31.9, adult: Secondary | ICD-10-CM

## 2023-10-20 DIAGNOSIS — I10 Essential (primary) hypertension: Secondary | ICD-10-CM

## 2023-10-20 DIAGNOSIS — Z6832 Body mass index (BMI) 32.0-32.9, adult: Secondary | ICD-10-CM

## 2023-10-20 DIAGNOSIS — I499 Cardiac arrhythmia, unspecified: Secondary | ICD-10-CM | POA: Insufficient documentation

## 2023-10-20 DIAGNOSIS — R7303 Prediabetes: Secondary | ICD-10-CM | POA: Insufficient documentation

## 2023-10-20 DIAGNOSIS — K219 Gastro-esophageal reflux disease without esophagitis: Secondary | ICD-10-CM | POA: Insufficient documentation

## 2023-10-20 DIAGNOSIS — E559 Vitamin D deficiency, unspecified: Secondary | ICD-10-CM

## 2023-10-20 DIAGNOSIS — Z6833 Body mass index (BMI) 33.0-33.9, adult: Secondary | ICD-10-CM

## 2023-10-20 DIAGNOSIS — M1990 Unspecified osteoarthritis, unspecified site: Secondary | ICD-10-CM | POA: Insufficient documentation

## 2023-10-20 DIAGNOSIS — J301 Allergic rhinitis due to pollen: Secondary | ICD-10-CM | POA: Insufficient documentation

## 2023-10-20 DIAGNOSIS — K8020 Calculus of gallbladder without cholecystitis without obstruction: Secondary | ICD-10-CM | POA: Insufficient documentation

## 2023-10-20 HISTORY — DX: Body mass index (BMI) 32.0-32.9, adult: Z68.32

## 2023-10-20 HISTORY — DX: Body mass index (BMI) 33.0-33.9, adult: Z68.33

## 2023-10-20 HISTORY — DX: Body mass index (BMI) 31.0-31.9, adult: Z68.31

## 2023-10-20 HISTORY — DX: Vitamin D deficiency, unspecified: E55.9

## 2023-10-20 HISTORY — DX: Essential (primary) hypertension: I10

## 2023-10-20 HISTORY — DX: Cardiac arrhythmia, unspecified: I49.9

## 2023-11-01 DIAGNOSIS — I10 Essential (primary) hypertension: Secondary | ICD-10-CM | POA: Insufficient documentation

## 2023-11-01 DIAGNOSIS — R42 Dizziness and giddiness: Secondary | ICD-10-CM | POA: Insufficient documentation

## 2023-11-01 DIAGNOSIS — K219 Gastro-esophageal reflux disease without esophagitis: Secondary | ICD-10-CM | POA: Insufficient documentation

## 2023-11-01 DIAGNOSIS — E559 Vitamin D deficiency, unspecified: Secondary | ICD-10-CM | POA: Insufficient documentation

## 2023-11-03 ENCOUNTER — Ambulatory Visit: Payer: Self-pay

## 2023-11-03 VITALS — BP 134/86 | HR 77 | Ht 70.0 in | Wt 227.2 lb

## 2023-11-03 DIAGNOSIS — R079 Chest pain, unspecified: Secondary | ICD-10-CM | POA: Insufficient documentation

## 2023-11-03 DIAGNOSIS — I10 Essential (primary) hypertension: Secondary | ICD-10-CM | POA: Insufficient documentation

## 2023-11-03 DIAGNOSIS — I4719 Other supraventricular tachycardia: Secondary | ICD-10-CM | POA: Diagnosis present

## 2023-11-03 DIAGNOSIS — R42 Dizziness and giddiness: Secondary | ICD-10-CM | POA: Insufficient documentation

## 2023-11-03 MED ORDER — ASPIRIN 81 MG PO TBEC: 81.0000 mg | DELAYED_RELEASE_TABLET | Freq: Every day | ORAL | Status: AC

## 2023-11-03 MED ORDER — METOPROLOL SUCCINATE ER 25 MG PO TB24: 25.0000 mg | ORAL_TABLET | Freq: Every day | ORAL | 2 refills | Status: DC

## 2023-11-03 NOTE — Patient Instructions (Addendum)
 Medication Instructions:  Your physician recommends that you continue on your current medications as Start aspirin  81 mg once a day   Start Metoprolol  succinate 25 mg once a day   *If you need a refill on your cardiac medications before your next appointment, please call your pharmacy*   Lab Work: Your physician recommends that you have labs done in the office today. Your test include Lipid panel  If you have labs (blood work) drawn today and your tests are completely normal, you will receive your results only by: MyChart Message (if you have MyChart) OR A paper copy in the mail If you have any lab test that is abnormal or we need to change your treatment, we will call you to review the results.   Testing/Procedures: You are scheduled for a Myocardial Perfusion Imaging Study.  Please arrive 15 minutes prior to your appointment time for registration and insurance purposes.  The test will take approximately 3 to 4 hours to complete; you may bring reading material.  If someone comes with you to your appointment, they will need to remain in the main lobby due to limited space in the testing area.   How to prepare for your Myocardial Perfusion Test: Do not eat or drink 3 hours prior to your test, except you may have water. Do not consume products containing caffeine  (regular or decaffeinated) 12 hours prior to your test. (ex: coffee, chocolate, sodas, tea). Do bring a list of your current medications with you.  If not listed below, you may take your medications as normal. Do not take metoprolol  (Lopressor , Toprol ) for 24 hours prior to the test.  Bring the medication to your appointment as you may be required to take it once the test is complete. Do wear comfortable clothes (no dresses or overalls) and walking shoes, tennis shoes preferred (No heels or open toe shoes are allowed). Do NOT wear cologne, perfume, aftershave, or lotions (deodorant is allowed). If these instructions are not  followed, your test will have to be rescheduled.  If you cannot keep your appointment, please provide 24 hours notification to the Nuclear Lab, to avoid a possible $50 charge to your account.  Follow-Up: At [facility], you and your health needs are our priority.  As part of our continuing mission to provide you with exceptional heart care, we have created designated Provider Care Teams.  These Care Teams include your primary Cardiologist (physician) and Advanced Practice Providers (APPs -  Physician Assistants and Nurse Practitioners) who all work together to provide you with the care you need, when you need it.  We recommend signing up for the patient portal called "MyChart".  Sign up information is provided on this After Visit Summary.  MyChart is used to connect with patients for Virtual Visits (Telemedicine).  Patients are able to view lab/test results, encounter notes, upcoming appointments, etc.  Non-urgent messages can be sent to your provider as well.   To learn more about what you can do with MyChart, go to [email or web address].    Your next appointment:   Based on test results   Other Instructions  Cardiac Nuclear Scan A cardiac nuclear scan is a test that is done to check the flow of blood to your heart. It is done when you are resting and when you are exercising. The test looks for problems such as: Not enough blood reaching a portion of the heart. The heart muscle not working as it should. You may need this test if you  have: Heart disease. Lab results that are not normal. Had heart surgery or a balloon procedure to open up blocked arteries (angioplasty) or a small mesh tube (stent). Chest pain. Shortness of breath. Had a heart attack. In this test, a special dye (tracer) is put into your bloodstream. The tracer will travel to your heart. A camera will then take pictures of your heart to see how the tracer moves through your heart. This test is usually done at a  hospital and takes 2-4 hours. Tell a doctor about: Any allergies you have. All medicines you are taking, including vitamins, herbs, eye drops, creams, and over-the-counter medicines. Any bleeding problems you have. Any surgeries you have had. Any medical conditions you have. Whether you are pregnant or may be pregnant. Any history of asthma or long-term (chronic) lung disease. Any history of heart rhythm disorders or heart valve conditions. What are the risks? Your doctor will talk with you about risks. These may include: Serious chest pain and heart attack. This is only a risk if the stress portion of the test is done. Fast or uneven heartbeats (palpitations). A feeling of warmth in your chest. This feeling usually does not last long. Allergic reaction to the tracer. Shortness of breath or trouble breathing. What happens before the test? Ask your doctor about changing or stopping your normal medicines. Follow instructions from your doctor about what you cannot eat or drink. Remove your jewelry on the day of the test. Ask your doctor if you need to avoid nicotine or caffeine . What happens during the test? An IV tube will be inserted into one of your veins. Your doctor will give you a small amount of tracer through the IV tube. You will wait for 20-40 minutes while the tracer moves through your bloodstream. Your heart will be monitored with an electrocardiogram (ECG). You will lie down on an exam table. Pictures of your heart will be taken for about 15-20 minutes. You may also have a stress test. For this test, one of these things may be done: You will be asked to exercise on a treadmill or a stationary bike. You will be given medicines that will make your heart work harder. This is done if you are unable to exercise. When blood flow to your heart has peaked, a tracer will again be given through the IV tube. After 20-40 minutes, you will get back on the exam table. More pictures will  be taken of your heart. Depending on the tracer that is used, more pictures may need to be taken 3-4 hours later. Your IV tube will be removed when the test is over. The test may vary among doctors and hospitals. What happens after the test? Ask your doctor: Whether you can return to your normal schedule, including diet, activities, travel, and medicines. Whether you should drink more fluids. This will help to remove the tracer from your body. Ask your doctor, or the department that is doing the test: When will my results be ready? How will I get my results? What are my treatment options? What other tests do I need? What are my next steps? This information is not intended to replace advice given to you by your health care provider. Make sure you discuss any questions you have with your health care provider. Document Revised: 11/23/2021 Document Reviewed: 11/23/2021 Elsevier Patient Education  2023 [employer].

## 2023-11-03 NOTE — Assessment & Plan Note (Signed)
 Well-controlled. Continue current medication losartan 25 mg once daily. Target below 130/80 mmHg.

## 2023-11-03 NOTE — Assessment & Plan Note (Signed)
 Atypical chest pain associated with shortness of breath occurring intermittently. Does have cardiovascular risk factors.  Will further assess with cardiac stress test with treadmill EKG nuclear imaging. Will obtain shortness of breath to assess cardiac structure and function.

## 2023-11-03 NOTE — Assessment & Plan Note (Signed)
 Appears to be associated with head position. No significant abnormality on ultrasound carotids. Reassured him at this time about no significant abnormalities noted on ultrasound carotids or event monitor. Will review further cardiac workup as discussed above.

## 2023-11-03 NOTE — Progress Notes (Addendum)
 Cardiology Consultation:    Date:  11/03/2023   ID:  David Whitaker, DOB 23-Feb-1961, MRN [id number]  PCP:  [facility]  Cardiologist:  Daymon Evans Stacia Feazell, MD   Referring MD: Marce Sensing, NP   No chief complaint on file.    ASSESSMENT AND PLAN:   [name] 63 year old male history of hypertension, GERD, obesity, dizziness, syncopal episodes while straining on toilet suggestive of vasovagal episodes in the past, prediabetes, high suspicion for sleep apnea. Prior Lexiscan stress test with nuclear imaging from May 2021 at [facility] noted no evidence of ischemia. Denies any prior history of CAD, CHF, MI, CVA. No significant carotid artery disease on ultrasound carotids from March 2025.  Heart monitor for 7 days with rare ventricular and supraventricular ectopy burden with short runs of supraventricular ectopy up to 23 beats.  Short run of NSVT 5 beats.  Problem List Items Addressed This Visit     Chest pain   Atypical chest pain associated with shortness of breath occurring intermittently. Does have cardiovascular risk factors.  Will further assess with cardiac stress test with treadmill EKG nuclear imaging. Will obtain shortness of breath to assess cardiac structure and function.       Relevant Orders   Lipid panel   MYOCARDIAL PERFUSION IMAGING   ECHOCARDIOGRAM COMPLETE   Cardiac arrhythmia, unspecified   Rare ventricular ectopy burden. No significant pauses, longest noted up to 2.2 seconds on recent heart monitor. Relatively asymptomatic.  Recommended to undergo evaluation for sleep apnea given his history of snoring. Starting low-dose metoprolol  succinate 25 mg once daily given his symptoms of atypical chest pain shortness of breath and short runs of NSVT and SVT noted on heart monitor.       Relevant Medications   aspirin  EC 81 MG tablet   metoprolol  succinate (TOPROL  XL) 25 MG 24 hr tablet   Dizziness and giddiness    Appears to be associated with head position. No significant abnormality on ultrasound carotids. Reassured him at this time about no significant abnormalities noted on ultrasound carotids or event monitor. Will review further cardiac workup as discussed above.       Relevant Orders   MYOCARDIAL PERFUSION IMAGING   Essential (primary) hypertension - Primary   Well-controlled. Continue current medication losartan 25 mg once daily. Target below 130/80 mmHg.      Relevant Medications   aspirin  EC 81 MG tablet   metoprolol  succinate (TOPROL  XL) 25 MG 24 hr tablet   Other Relevant Orders   EKG 12-Lead (Completed)   Return to clinic based on test results.   History of Present Illness:    David Whitaker is a 63 y.o. male who is being seen today for the evaluation of dizzy nests and abnormal heart monitor findings and episode of chest discomfort at the request of Marce Sensing, NP.  Has history of hypertension, GERD, obesity, dizziness, syncopal episodes while straining on toilet suggestive of vasovagal episodes in the past, prediabetes, high suspicion for sleep apnea. Prior Lexiscan stress test with nuclear imaging from May 2021 at [facility] noted no evidence of ischemia. Denies any prior history of CAD, CHF, MI, CVA.  Pleasant gentleman here for the visit by himself.  Lives at home with his wife. 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 Keeps himself busy with work at home.  No regular exercise.  Describes symptoms of shortness of breath and chest discomfort occasionally notices intermittently with  activity and at times with rest.  Has not noticed any significant increase in intensity over the last many months.  Describes symptoms of lightheadedness and dizziness notably while shaving and a particular spot on the right side of the neck with his head turned to the left side.  No syncope associated with these episodes.  Reports  syncopal episodes couple years ago while having to strain during bowel movements.  No other episodes of syncope.  Denies any palpitations.  During the monitor he had for 7 days recently denies any significant symptoms.  Denies any blood in urine or stools. Does report snoring through the night and mentions his wife noted he might have sleep apnea.  EKG in the clinic today shows sinus rhythm heart rate 77/min, PR interval normal 130 ms, QRS duration 82 ms.  No significant ST-T changes in comparison EKG from PCPs office 09/14/2023 was similar.  Ultrasound carotid arteries from 09/19/2023 at [facility] noted no significant hemodynamically significant stenosis.  Heart monitor from 201 6-20 25 through 3 14 2025 noted predominantly sinus rhythm with average heart rate 95/min [ranging from 68 bpm to 187 bpm.  Supraventricular ectopy burden.  0.06%.  Ventricular ectopy burden rare less than 0.01%.  Longest sinus pause was noted 2.2 seconds.  Multiple runs of supraventricular ectopy with the longest run lasting 23 beats.  There was 1 instance of nonsustained ventricular tachycardia lasting 5 beats asymptomatic during sleeping hours.  There was 1 patient triggered event that correlated with sinus tachycardia.  Blood work from 09-19-2023 hemoglobin A1c 5.9 suggest prediabetes Sodium 138, potassium 4.1, BUN 15, creatinine 1.19, EGFR 67.8 Normal AST 40, ALT mildly elevated 49, alkaline phosphatase normal 97. CBC with hemoglobin 16.9, hematocrit 51.4, WBC 6.8, platelets 241.  Past Medical History:  Diagnosis Date   Allergic rhinitis due to pollen    Arthritis    BMI 31.0-31.9,adult 10/20/2023   BMI 32.0-32.9,adult 10/20/2023   BMI 33.0-33.9,adult 10/20/2023   Calculus of gallbladder with acute cholecystitis without obstruction 04/21/2020   Cardiac arrhythmia, unspecified 10/20/2023   Chest pain 08/25/2016   Dizziness and giddiness    Encounter for screening colonoscopy 06/08/2020    Epigastric pain 06/08/2020   Essential (primary) hypertension    Family history of colon cancer in father 06/08/2020   Gallstones    Gastroesophageal reflux disease without esophagitis 06/08/2020   GERD (gastroesophageal reflux disease)    History of kidney stones    Hypertension, essential 10/20/2023   Postoperative examination 04/21/2020   Pre-diabetes    Vitamin D deficiency    Vitamin D deficiency, unspecified 10/20/2023    Past Surgical History:  Procedure Laterality Date   CHOLECYSTECTOMY  04/07/2020   EYE SURGERY  1990   SHOULDER ARTHROSCOPY WITH SUBACROMIAL DECOMPRESSION Left 10/19/2022   Procedure: SHOULDER ARTHROSCOPY WITH SUBACROMIAL DECOMPRESSION, CLAVICULOPLASTY, AND BICEPS TENOTOMY;  Surgeon: Orvan Blanch, MD;  Location: [facility];  Service: Orthopedics;  Laterality: Left;    Current Medications: Current Meds  Medication Sig   Ascorbic Acid (VITAMIN C) 500 MG CAPS Take 1 tablet by mouth daily.   aspirin  EC 81 MG tablet Take 1 tablet (81 mg total) by mouth daily. Swallow whole.   losartan (COZAAR) 25 MG tablet Take 25 mg by mouth daily.   metoprolol succinate (TOPROL XL) 25 MG 24 hr tablet Take 1 tablet (25 mg total) by mouth daily.   omeprazole (PRILOSEC) 20 MG capsule Take 20 mg by mouth daily.     Allergies:   Baclofen  Social History   Socioeconomic History   Marital status: Married    Spouse name: Not on file   Number of children: Not on file   Years of education: Not on file   Highest education level: Not on file  Occupational History   Not on file  Tobacco Use   Smoking status: Former    Current packs/day: 0.00    Average packs/day: 1.5 packs/day for 5.0 years (7.5 ttl pk-yrs)    Types: Cigarettes    Start date: 07/11/1974    Quit date: 07/12/1979    Years since quitting: 44.3   Smokeless tobacco: Never  Vaping Use   Vaping status: Never Used  Substance and Sexual Activity   Alcohol use: Not Currently    Comment: occasionally   Drug use: No    Sexual activity: Not on file  Other Topics Concern   Not on file  Social History Narrative   Not on file   Social Drivers of Health   Financial Resource Strain: Not on file  Food Insecurity: Not on file  Transportation Needs: Not on file  Physical Activity: Not on file  Stress: Not on file  Social Connections: Not on file     Family History: The patient's family history includes Colon cancer in his father; Diabetes in his brother, father, mother, and sister; Heart disease in his brother and sister; Heart failure in his father and mother; Lung cancer in his sister; Throat cancer in his mother. ROS:   Please see the history of present illness.    All 14 point review of systems negative except as described per history of present illness.  EKGs/Labs/Other Studies Reviewed:    The following studies were reviewed today:   EKG:  EKG Interpretation Date/Time:  Friday November 03 2023 08:28:58 EDT Ventricular Rate:  77 PR Interval:  130 QRS Duration:  82 QT Interval:  394 QTC Calculation: 445 R Axis:   29  Text Interpretation: Normal sinus rhythm Normal ECG When compared with ECG of 25-Sep-2017 08:23, PREVIOUS ECG IS PRESENT Confirmed by Bertha Broad reddy [phone]) on 11/03/2023 8:49:26 AM    Recent Labs: No results found for requested labs within last 365 days.  Recent Lipid Panel No results found for: "CHOL", "TRIG", "HDL", "CHOLHDL", "VLDL", "LDLCALC", "LDLDIRECT"  Physical Exam:    VS:  BP 134/86   Pulse 77   Ht 5\' 10"  (1.778 m)   Wt 227 lb 3.2 oz (103.1 kg)   SpO2 96%   BMI 32.60 kg/m     Wt Readings from Last 3 Encounters:  11/03/23 227 lb 3.2 oz (103.1 kg)  10/16/23 222 lb (100.7 kg)  10/19/22 228 lb 3.2 oz (103.5 kg)     GENERAL:  Well nourished, well developed in no acute distress NECK: No JVD; No carotid bruits CARDIAC: RRR, S1 and S2 present, no murmurs, no rubs, no gallops CHEST:  Clear to auscultation without rales, wheezing or rhonchi   Extremities: No pitting pedal edema. Pulses bilaterally symmetric with radial 2+ and dorsalis pedis 2+ NEUROLOGIC:  Alert and oriented x 3  Medication Adjustments/Labs and Tests Ordered: Current medicines are reviewed at length with the patient today.  Concerns regarding medicines are outlined above.  Orders Placed This Encounter  Procedures   Lipid panel   MYOCARDIAL PERFUSION IMAGING   EKG 12-Lead   ECHOCARDIOGRAM COMPLETE   Meds ordered this encounter  Medications   aspirin  EC 81 MG tablet    Sig: Take 1 tablet (81 mg  total) by mouth daily. Swallow whole.   metoprolol succinate (TOPROL XL) 25 MG 24 hr tablet    Sig: Take 1 tablet (25 mg total) by mouth daily.    Dispense:  90 tablet    Refill:  2    Signed, Jameis Newsham reddy Dajanae Brophy, MD, MPH, [facility]. 11/03/2023 9:37 AM    [facility]

## 2023-11-03 NOTE — Assessment & Plan Note (Signed)
 Rare ventricular ectopy burden. No significant pauses, longest noted up to 2.2 seconds on recent heart monitor. Relatively asymptomatic.  Recommended to undergo evaluation for sleep apnea given his history of snoring. Starting low-dose metoprolol succinate 25 mg once daily given his symptoms of atypical chest pain shortness of breath and short runs of NSVT and SVT noted on heart monitor.

## 2023-11-03 NOTE — Addendum Note (Signed)
 Addended by: Bertha Broad REDDY on: 11/03/2023 01:07 PM   Modules accepted: Orders

## 2023-11-04 LAB — LIPID PANEL
Chol/HDL Ratio: 5.3 ratio — ABNORMAL HIGH (ref 0.0–5.0)
Cholesterol, Total: 149 mg/dL (ref 100–199)
HDL: 28 mg/dL — ABNORMAL LOW (ref >39)
LDL Chol Calc (NIH): 80 mg/dL (ref 0–99)
Triglycerides: 248 mg/dL — ABNORMAL HIGH (ref 0–149)
VLDL Cholesterol Cal: 41 mg/dL — ABNORMAL HIGH (ref 5–40)

## 2023-11-06 ENCOUNTER — Ambulatory Visit

## 2023-11-06 DIAGNOSIS — R079 Chest pain, unspecified: Secondary | ICD-10-CM | POA: Insufficient documentation

## 2023-11-07 LAB — ECHOCARDIOGRAM COMPLETE
P 1/2 time: 670 ms
S' Lateral: 3.3 cm

## 2023-11-28 ENCOUNTER — Ambulatory Visit

## 2023-11-28 DIAGNOSIS — R079 Chest pain, unspecified: Secondary | ICD-10-CM | POA: Diagnosis present

## 2023-11-28 DIAGNOSIS — R42 Dizziness and giddiness: Secondary | ICD-10-CM | POA: Diagnosis present

## 2023-11-28 MED ORDER — TECHNETIUM TC 99M TETROFOSMIN IV KIT
10.3000 | PACK | Freq: Once | INTRAVENOUS | Status: AC | PRN
  Administered 2023-11-28: 10.3 via INTRAVENOUS

## 2023-11-28 MED ORDER — TECHNETIUM TC 99M TETROFOSMIN IV KIT
32.1000 | PACK | Freq: Once | INTRAVENOUS | Status: AC | PRN
  Administered 2023-11-28: 32.1 via INTRAVENOUS

## 2023-11-30 LAB — MYOCARDIAL PERFUSION IMAGING
Angina Index: 0
Duke Treadmill Score: 6
Estimated workload: 7
Exercise duration (min): 6 min
Exercise duration (sec): 0 s
LV dias vol: 67 mL (ref 62–150)
LV sys vol: 30 mL
MPHR: 155 {beats}/min
Nuc Stress EF: 55 %
Peak HR: 157 {beats}/min
Percent HR: 101 %
Rest HR: 75 {beats}/min
Rest Nuclear Isotope Dose: 10.3 mCi
SDS: 2
SRS: 2
SSS: 4
ST Depression (mm): 0 mm
Stress Nuclear Isotope Dose: 32.1 mCi
TID: 0.99

## 2023-12-06 ENCOUNTER — Ambulatory Visit: Payer: Self-pay

## 2024-07-21 ENCOUNTER — Other Ambulatory Visit: Payer: Self-pay
